# Patient Record
Sex: Male | Born: 1935 | Race: White | Hispanic: No | Smoking: Never smoker
Health system: Southern US, Community
[De-identification: ages and names within clinical notes are randomized; demographics above are authoritative.]

## PROBLEM LIST (undated history)

## (undated) DIAGNOSIS — K279 Peptic ulcer, site unspecified, unspecified as acute or chronic, without hemorrhage or perforation: Secondary | ICD-10-CM

## (undated) DIAGNOSIS — K219 Gastro-esophageal reflux disease without esophagitis: Secondary | ICD-10-CM

## (undated) DIAGNOSIS — M1711 Unilateral primary osteoarthritis, right knee: Secondary | ICD-10-CM

## (undated) DIAGNOSIS — I10 Essential (primary) hypertension: Secondary | ICD-10-CM

## (undated) DIAGNOSIS — E785 Hyperlipidemia, unspecified: Secondary | ICD-10-CM

## (undated) DIAGNOSIS — Z96652 Presence of left artificial knee joint: Secondary | ICD-10-CM

## (undated) DIAGNOSIS — M1712 Unilateral primary osteoarthritis, left knee: Secondary | ICD-10-CM

## (undated) HISTORY — DX: Peptic ulcer, site unspecified, unspecified as acute or chronic, without hemorrhage or perforation: K27.9

## (undated) HISTORY — PX: REPLACEMENT TOTAL KNEE: SUR1224

## (undated) HISTORY — DX: Essential (primary) hypertension: I10

## (undated) HISTORY — PX: FINGER AMPUTATION: SHX636

## (undated) HISTORY — PX: CATARACT EXTRACTION, BILATERAL: SHX1313

---

## 2001-09-07 HISTORY — PX: CHOLECYSTECTOMY: SHX55

## 2004-06-24 ENCOUNTER — Ambulatory Visit: Payer: Self-pay | Admitting: General Surgery

## 2004-07-01 ENCOUNTER — Ambulatory Visit: Payer: Self-pay | Admitting: General Surgery

## 2005-11-30 ENCOUNTER — Emergency Department: Payer: Self-pay | Admitting: Emergency Medicine

## 2005-11-30 ENCOUNTER — Other Ambulatory Visit: Payer: Self-pay

## 2005-12-29 ENCOUNTER — Ambulatory Visit: Payer: Self-pay | Admitting: Urology

## 2005-12-29 ENCOUNTER — Other Ambulatory Visit: Payer: Self-pay

## 2005-12-31 ENCOUNTER — Ambulatory Visit: Payer: Self-pay | Admitting: Urology

## 2006-06-28 ENCOUNTER — Ambulatory Visit: Payer: Self-pay | Admitting: Internal Medicine

## 2008-06-21 ENCOUNTER — Ambulatory Visit: Payer: Self-pay | Admitting: Internal Medicine

## 2008-10-08 ENCOUNTER — Ambulatory Visit: Payer: Self-pay | Admitting: Specialist

## 2008-10-17 ENCOUNTER — Ambulatory Visit: Payer: Self-pay | Admitting: Otolaryngology

## 2009-10-17 ENCOUNTER — Ambulatory Visit: Payer: Self-pay | Admitting: Otolaryngology

## 2011-01-16 ENCOUNTER — Ambulatory Visit: Payer: Self-pay

## 2012-02-09 DIAGNOSIS — M159 Polyosteoarthritis, unspecified: Secondary | ICD-10-CM | POA: Diagnosis not present

## 2012-02-09 DIAGNOSIS — I10 Essential (primary) hypertension: Secondary | ICD-10-CM | POA: Diagnosis not present

## 2012-02-09 DIAGNOSIS — M25569 Pain in unspecified knee: Secondary | ICD-10-CM | POA: Diagnosis not present

## 2012-02-09 DIAGNOSIS — K219 Gastro-esophageal reflux disease without esophagitis: Secondary | ICD-10-CM | POA: Diagnosis not present

## 2012-02-09 DIAGNOSIS — E782 Mixed hyperlipidemia: Secondary | ICD-10-CM | POA: Diagnosis not present

## 2012-02-18 DIAGNOSIS — M1710 Unilateral primary osteoarthritis, unspecified knee: Secondary | ICD-10-CM | POA: Diagnosis not present

## 2012-02-19 ENCOUNTER — Ambulatory Visit: Payer: Self-pay | Admitting: Orthopedic Surgery

## 2012-02-19 DIAGNOSIS — M1710 Unilateral primary osteoarthritis, unspecified knee: Secondary | ICD-10-CM | POA: Diagnosis not present

## 2012-02-19 DIAGNOSIS — IMO0002 Reserved for concepts with insufficient information to code with codable children: Secondary | ICD-10-CM | POA: Diagnosis not present

## 2012-02-19 DIAGNOSIS — M25569 Pain in unspecified knee: Secondary | ICD-10-CM | POA: Diagnosis not present

## 2012-03-31 DIAGNOSIS — M1710 Unilateral primary osteoarthritis, unspecified knee: Secondary | ICD-10-CM | POA: Diagnosis not present

## 2012-05-30 DIAGNOSIS — Z23 Encounter for immunization: Secondary | ICD-10-CM | POA: Diagnosis not present

## 2012-08-01 DIAGNOSIS — I10 Essential (primary) hypertension: Secondary | ICD-10-CM | POA: Diagnosis not present

## 2012-08-01 DIAGNOSIS — Z0000 Encounter for general adult medical examination without abnormal findings: Secondary | ICD-10-CM | POA: Diagnosis not present

## 2012-08-01 DIAGNOSIS — E782 Mixed hyperlipidemia: Secondary | ICD-10-CM | POA: Diagnosis not present

## 2012-08-08 DIAGNOSIS — K116 Mucocele of salivary gland: Secondary | ICD-10-CM | POA: Diagnosis not present

## 2012-08-08 DIAGNOSIS — K219 Gastro-esophageal reflux disease without esophagitis: Secondary | ICD-10-CM | POA: Diagnosis not present

## 2012-08-08 DIAGNOSIS — E041 Nontoxic single thyroid nodule: Secondary | ICD-10-CM | POA: Diagnosis not present

## 2012-08-09 DIAGNOSIS — Z0000 Encounter for general adult medical examination without abnormal findings: Secondary | ICD-10-CM | POA: Diagnosis not present

## 2012-08-09 DIAGNOSIS — E782 Mixed hyperlipidemia: Secondary | ICD-10-CM | POA: Diagnosis not present

## 2012-08-09 DIAGNOSIS — K219 Gastro-esophageal reflux disease without esophagitis: Secondary | ICD-10-CM | POA: Diagnosis not present

## 2012-08-09 DIAGNOSIS — I10 Essential (primary) hypertension: Secondary | ICD-10-CM | POA: Diagnosis not present

## 2012-11-15 DIAGNOSIS — K920 Hematemesis: Secondary | ICD-10-CM | POA: Diagnosis not present

## 2012-11-15 DIAGNOSIS — E782 Mixed hyperlipidemia: Secondary | ICD-10-CM | POA: Diagnosis not present

## 2012-11-15 DIAGNOSIS — K921 Melena: Secondary | ICD-10-CM | POA: Diagnosis not present

## 2012-11-15 DIAGNOSIS — I10 Essential (primary) hypertension: Secondary | ICD-10-CM | POA: Diagnosis not present

## 2012-11-15 DIAGNOSIS — K219 Gastro-esophageal reflux disease without esophagitis: Secondary | ICD-10-CM | POA: Diagnosis not present

## 2012-11-16 DIAGNOSIS — Z006 Encounter for examination for normal comparison and control in clinical research program: Secondary | ICD-10-CM | POA: Diagnosis not present

## 2012-11-16 DIAGNOSIS — K219 Gastro-esophageal reflux disease without esophagitis: Secondary | ICD-10-CM | POA: Diagnosis not present

## 2012-11-16 DIAGNOSIS — I10 Essential (primary) hypertension: Secondary | ICD-10-CM | POA: Diagnosis not present

## 2012-11-22 DIAGNOSIS — K920 Hematemesis: Secondary | ICD-10-CM | POA: Diagnosis not present

## 2012-11-22 DIAGNOSIS — Z8719 Personal history of other diseases of the digestive system: Secondary | ICD-10-CM | POA: Diagnosis not present

## 2012-11-22 DIAGNOSIS — K921 Melena: Secondary | ICD-10-CM | POA: Diagnosis not present

## 2012-11-23 DIAGNOSIS — M159 Polyosteoarthritis, unspecified: Secondary | ICD-10-CM | POA: Diagnosis not present

## 2012-11-23 DIAGNOSIS — I10 Essential (primary) hypertension: Secondary | ICD-10-CM | POA: Diagnosis not present

## 2012-11-23 DIAGNOSIS — K920 Hematemesis: Secondary | ICD-10-CM | POA: Diagnosis not present

## 2012-11-23 DIAGNOSIS — K219 Gastro-esophageal reflux disease without esophagitis: Secondary | ICD-10-CM | POA: Diagnosis not present

## 2012-11-23 DIAGNOSIS — Z006 Encounter for examination for normal comparison and control in clinical research program: Secondary | ICD-10-CM | POA: Diagnosis not present

## 2012-11-23 DIAGNOSIS — E040 Nontoxic diffuse goiter: Secondary | ICD-10-CM | POA: Diagnosis not present

## 2012-12-05 ENCOUNTER — Ambulatory Visit: Payer: Self-pay | Admitting: Unknown Physician Specialty

## 2012-12-05 DIAGNOSIS — K2970 Gastritis, unspecified, without bleeding: Secondary | ICD-10-CM | POA: Diagnosis not present

## 2012-12-05 DIAGNOSIS — Z79899 Other long term (current) drug therapy: Secondary | ICD-10-CM | POA: Diagnosis not present

## 2012-12-05 DIAGNOSIS — K2990 Gastroduodenitis, unspecified, without bleeding: Secondary | ICD-10-CM | POA: Diagnosis not present

## 2012-12-05 DIAGNOSIS — K449 Diaphragmatic hernia without obstruction or gangrene: Secondary | ICD-10-CM | POA: Diagnosis not present

## 2012-12-05 DIAGNOSIS — M47817 Spondylosis without myelopathy or radiculopathy, lumbosacral region: Secondary | ICD-10-CM | POA: Diagnosis not present

## 2012-12-05 DIAGNOSIS — I10 Essential (primary) hypertension: Secondary | ICD-10-CM | POA: Diagnosis not present

## 2012-12-05 DIAGNOSIS — E785 Hyperlipidemia, unspecified: Secondary | ICD-10-CM | POA: Diagnosis not present

## 2012-12-05 DIAGNOSIS — K2971 Gastritis, unspecified, with bleeding: Secondary | ICD-10-CM | POA: Diagnosis not present

## 2012-12-16 DIAGNOSIS — G458 Other transient cerebral ischemic attacks and related syndromes: Secondary | ICD-10-CM | POA: Diagnosis not present

## 2013-02-06 DIAGNOSIS — K277 Chronic peptic ulcer, site unspecified, without hemorrhage or perforation: Secondary | ICD-10-CM | POA: Diagnosis not present

## 2013-02-06 DIAGNOSIS — I10 Essential (primary) hypertension: Secondary | ICD-10-CM | POA: Diagnosis not present

## 2013-02-06 DIAGNOSIS — M25569 Pain in unspecified knee: Secondary | ICD-10-CM | POA: Diagnosis not present

## 2013-02-06 DIAGNOSIS — M159 Polyosteoarthritis, unspecified: Secondary | ICD-10-CM | POA: Diagnosis not present

## 2013-02-06 DIAGNOSIS — K219 Gastro-esophageal reflux disease without esophagitis: Secondary | ICD-10-CM | POA: Diagnosis not present

## 2013-05-09 DIAGNOSIS — I10 Essential (primary) hypertension: Secondary | ICD-10-CM | POA: Diagnosis not present

## 2013-05-09 DIAGNOSIS — L255 Unspecified contact dermatitis due to plants, except food: Secondary | ICD-10-CM | POA: Diagnosis not present

## 2013-05-09 DIAGNOSIS — R21 Rash and other nonspecific skin eruption: Secondary | ICD-10-CM | POA: Diagnosis not present

## 2013-05-09 DIAGNOSIS — K219 Gastro-esophageal reflux disease without esophagitis: Secondary | ICD-10-CM | POA: Diagnosis not present

## 2013-05-30 DIAGNOSIS — Z23 Encounter for immunization: Secondary | ICD-10-CM | POA: Diagnosis not present

## 2013-07-04 DIAGNOSIS — H2510 Age-related nuclear cataract, unspecified eye: Secondary | ICD-10-CM | POA: Diagnosis not present

## 2013-07-06 DIAGNOSIS — I669 Occlusion and stenosis of unspecified cerebral artery: Secondary | ICD-10-CM | POA: Diagnosis not present

## 2013-07-10 DIAGNOSIS — E041 Nontoxic single thyroid nodule: Secondary | ICD-10-CM | POA: Diagnosis not present

## 2013-07-10 DIAGNOSIS — K1370 Unspecified lesions of oral mucosa: Secondary | ICD-10-CM | POA: Diagnosis not present

## 2013-07-10 DIAGNOSIS — K219 Gastro-esophageal reflux disease without esophagitis: Secondary | ICD-10-CM | POA: Diagnosis not present

## 2013-08-16 DIAGNOSIS — Z0000 Encounter for general adult medical examination without abnormal findings: Secondary | ICD-10-CM | POA: Diagnosis not present

## 2013-08-16 DIAGNOSIS — E782 Mixed hyperlipidemia: Secondary | ICD-10-CM | POA: Diagnosis not present

## 2013-08-16 DIAGNOSIS — R300 Dysuria: Secondary | ICD-10-CM | POA: Diagnosis not present

## 2013-08-16 DIAGNOSIS — I10 Essential (primary) hypertension: Secondary | ICD-10-CM | POA: Diagnosis not present

## 2013-08-16 DIAGNOSIS — I669 Occlusion and stenosis of unspecified cerebral artery: Secondary | ICD-10-CM | POA: Diagnosis not present

## 2013-08-16 DIAGNOSIS — K219 Gastro-esophageal reflux disease without esophagitis: Secondary | ICD-10-CM | POA: Diagnosis not present

## 2013-08-17 DIAGNOSIS — Z0000 Encounter for general adult medical examination without abnormal findings: Secondary | ICD-10-CM | POA: Diagnosis not present

## 2013-08-17 DIAGNOSIS — I10 Essential (primary) hypertension: Secondary | ICD-10-CM | POA: Diagnosis not present

## 2013-08-17 DIAGNOSIS — E782 Mixed hyperlipidemia: Secondary | ICD-10-CM | POA: Diagnosis not present

## 2014-01-02 DIAGNOSIS — H2510 Age-related nuclear cataract, unspecified eye: Secondary | ICD-10-CM | POA: Diagnosis not present

## 2014-02-14 DIAGNOSIS — K219 Gastro-esophageal reflux disease without esophagitis: Secondary | ICD-10-CM | POA: Diagnosis not present

## 2014-02-14 DIAGNOSIS — K277 Chronic peptic ulcer, site unspecified, without hemorrhage or perforation: Secondary | ICD-10-CM | POA: Diagnosis not present

## 2014-02-14 DIAGNOSIS — M25569 Pain in unspecified knee: Secondary | ICD-10-CM | POA: Diagnosis not present

## 2014-02-14 DIAGNOSIS — E782 Mixed hyperlipidemia: Secondary | ICD-10-CM | POA: Diagnosis not present

## 2014-05-22 DIAGNOSIS — Z23 Encounter for immunization: Secondary | ICD-10-CM | POA: Diagnosis not present

## 2014-08-21 DIAGNOSIS — M150 Primary generalized (osteo)arthritis: Secondary | ICD-10-CM | POA: Diagnosis not present

## 2014-08-21 DIAGNOSIS — Z0001 Encounter for general adult medical examination with abnormal findings: Secondary | ICD-10-CM | POA: Diagnosis not present

## 2014-08-21 DIAGNOSIS — Z125 Encounter for screening for malignant neoplasm of prostate: Secondary | ICD-10-CM | POA: Diagnosis not present

## 2014-08-21 DIAGNOSIS — E782 Mixed hyperlipidemia: Secondary | ICD-10-CM | POA: Diagnosis not present

## 2014-08-21 DIAGNOSIS — I10 Essential (primary) hypertension: Secondary | ICD-10-CM | POA: Diagnosis not present

## 2014-08-21 DIAGNOSIS — R300 Dysuria: Secondary | ICD-10-CM | POA: Diagnosis not present

## 2014-08-27 DIAGNOSIS — Z125 Encounter for screening for malignant neoplasm of prostate: Secondary | ICD-10-CM | POA: Diagnosis not present

## 2014-08-27 DIAGNOSIS — Z0001 Encounter for general adult medical examination with abnormal findings: Secondary | ICD-10-CM | POA: Diagnosis not present

## 2014-08-27 DIAGNOSIS — E782 Mixed hyperlipidemia: Secondary | ICD-10-CM | POA: Diagnosis not present

## 2015-01-02 DIAGNOSIS — M25561 Pain in right knee: Secondary | ICD-10-CM | POA: Diagnosis not present

## 2015-01-02 DIAGNOSIS — M170 Bilateral primary osteoarthritis of knee: Secondary | ICD-10-CM | POA: Diagnosis not present

## 2015-01-02 DIAGNOSIS — M25562 Pain in left knee: Secondary | ICD-10-CM | POA: Diagnosis not present

## 2015-01-04 ENCOUNTER — Ambulatory Visit
Admit: 2015-01-04 | Disposition: A | Discharge status: Home / Self Care | Payer: Self-pay | Admitting: Orthopedic Surgery

## 2015-01-04 DIAGNOSIS — M170 Bilateral primary osteoarthritis of knee: Secondary | ICD-10-CM | POA: Diagnosis not present

## 2015-01-04 DIAGNOSIS — M1712 Unilateral primary osteoarthritis, left knee: Secondary | ICD-10-CM | POA: Diagnosis not present

## 2015-01-04 DIAGNOSIS — M1711 Unilateral primary osteoarthritis, right knee: Secondary | ICD-10-CM | POA: Diagnosis not present

## 2015-01-30 DIAGNOSIS — M170 Bilateral primary osteoarthritis of knee: Secondary | ICD-10-CM | POA: Diagnosis not present

## 2015-02-06 DIAGNOSIS — M170 Bilateral primary osteoarthritis of knee: Secondary | ICD-10-CM | POA: Diagnosis not present

## 2015-02-13 DIAGNOSIS — M170 Bilateral primary osteoarthritis of knee: Secondary | ICD-10-CM | POA: Diagnosis not present

## 2015-02-21 DIAGNOSIS — K219 Gastro-esophageal reflux disease without esophagitis: Secondary | ICD-10-CM | POA: Diagnosis not present

## 2015-02-21 DIAGNOSIS — M179 Osteoarthritis of knee, unspecified: Secondary | ICD-10-CM | POA: Diagnosis not present

## 2015-02-21 DIAGNOSIS — E782 Mixed hyperlipidemia: Secondary | ICD-10-CM | POA: Diagnosis not present

## 2015-03-04 DIAGNOSIS — Z111 Encounter for screening for respiratory tuberculosis: Secondary | ICD-10-CM | POA: Diagnosis not present

## 2015-03-12 DIAGNOSIS — Z111 Encounter for screening for respiratory tuberculosis: Secondary | ICD-10-CM | POA: Diagnosis not present

## 2015-08-26 DIAGNOSIS — E782 Mixed hyperlipidemia: Secondary | ICD-10-CM | POA: Diagnosis not present

## 2015-08-26 DIAGNOSIS — K219 Gastro-esophageal reflux disease without esophagitis: Secondary | ICD-10-CM | POA: Diagnosis not present

## 2015-08-26 DIAGNOSIS — M179 Osteoarthritis of knee, unspecified: Secondary | ICD-10-CM | POA: Diagnosis not present

## 2015-08-26 DIAGNOSIS — Z0001 Encounter for general adult medical examination with abnormal findings: Secondary | ICD-10-CM | POA: Diagnosis not present

## 2015-09-03 DIAGNOSIS — Z0001 Encounter for general adult medical examination with abnormal findings: Secondary | ICD-10-CM | POA: Diagnosis not present

## 2015-09-03 DIAGNOSIS — Z125 Encounter for screening for malignant neoplasm of prostate: Secondary | ICD-10-CM | POA: Diagnosis not present

## 2015-09-03 DIAGNOSIS — E782 Mixed hyperlipidemia: Secondary | ICD-10-CM | POA: Diagnosis not present

## 2015-09-03 DIAGNOSIS — Z1211 Encounter for screening for malignant neoplasm of colon: Secondary | ICD-10-CM | POA: Diagnosis not present

## 2015-12-14 IMAGING — CR DG KNEE 1-2V BILAT
1 series · 2 of 2 positions shown · non-contrast
Comparison: None.

CLINICAL DATA: Bilateral knee pain.

EXAM:
BILATERAL KNEE 1-2 VIEW(S)

[Series 1: kdxr bilateral knees standing · 0.14mm/px · 2 of 2 slices shown]
[im 1/2]
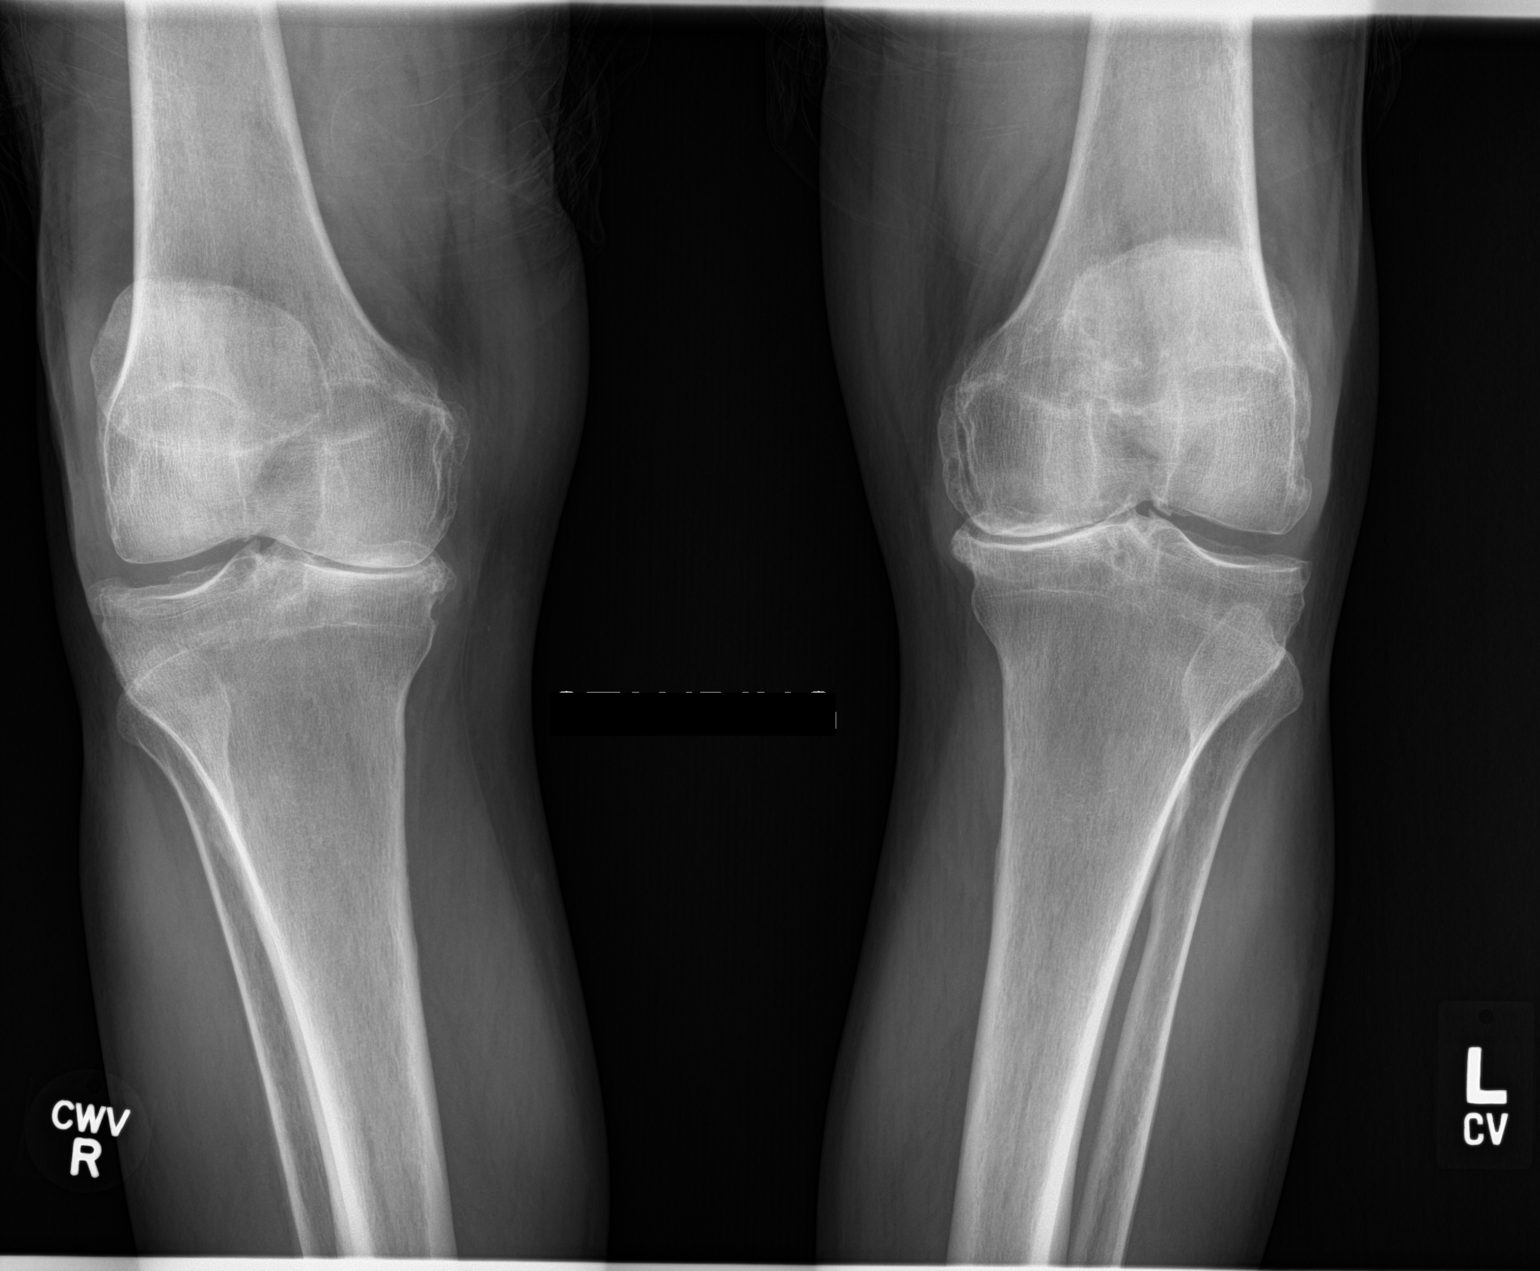
[im 2/2]
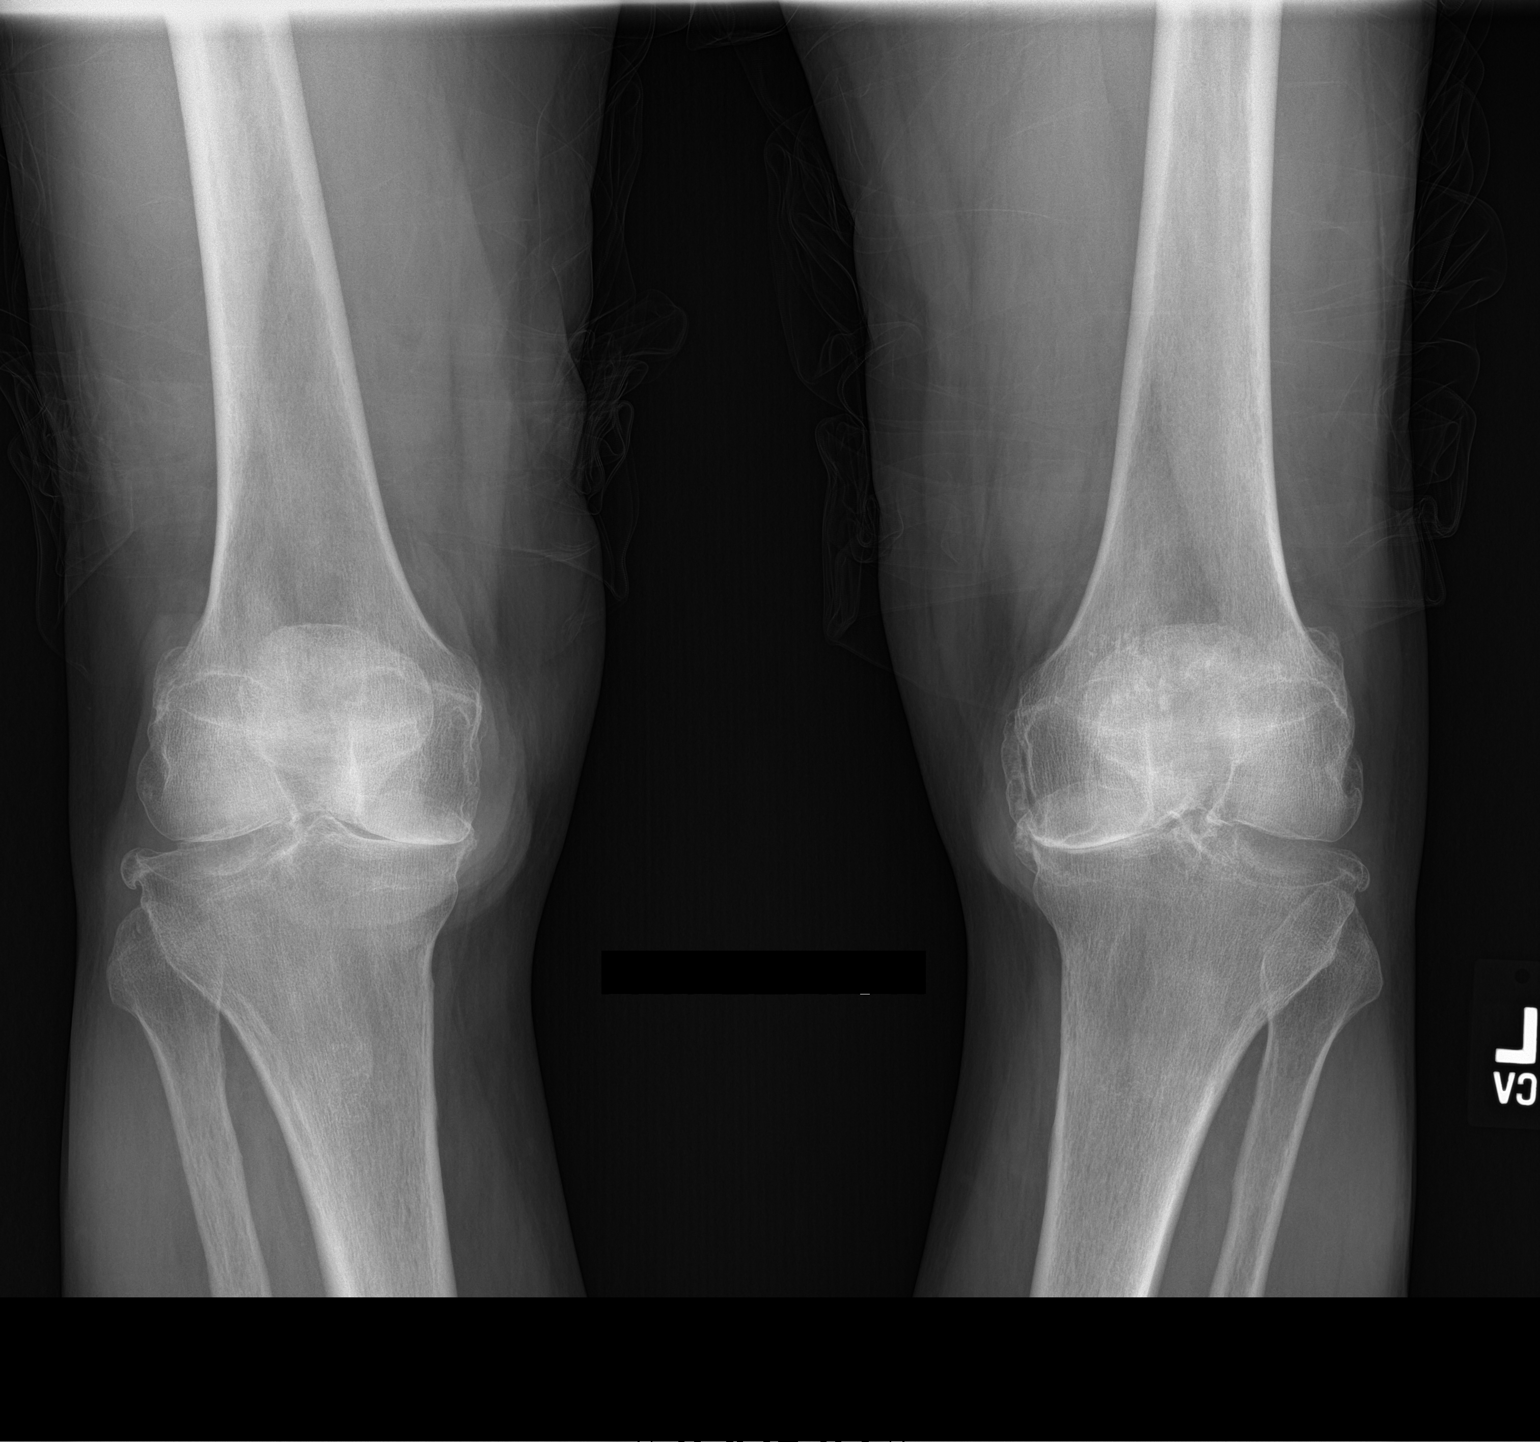

[2 of 2 positions shown; findings below may reference images not displayed]

FINDINGS: No acute bony or joint abnormality. Severe diffuse tricompartment
degenerative change. Degenerative changes most severe about the
medial compartment.
IMPRESSION: Severe tricompartment degenerative change. Degenerative changes most
prominent about the medial compartment. No acute abnormality.

## 2016-02-12 DIAGNOSIS — L237 Allergic contact dermatitis due to plants, except food: Secondary | ICD-10-CM | POA: Diagnosis not present

## 2016-02-21 ENCOUNTER — Ambulatory Visit
Admission: RE | Admit: 2016-02-21 | Discharge: 2016-02-21 | Disposition: A | Discharge status: Home / Self Care | Payer: Medicare Other | Source: Ambulatory Visit | Attending: Orthopedic Surgery | Admitting: Orthopedic Surgery

## 2016-02-21 ENCOUNTER — Other Ambulatory Visit: Payer: Self-pay | Admitting: Orthopedic Surgery

## 2016-02-21 DIAGNOSIS — M170 Bilateral primary osteoarthritis of knee: Secondary | ICD-10-CM | POA: Insufficient documentation

## 2016-02-21 DIAGNOSIS — M25461 Effusion, right knee: Secondary | ICD-10-CM | POA: Diagnosis not present

## 2016-02-21 DIAGNOSIS — M25462 Effusion, left knee: Secondary | ICD-10-CM | POA: Insufficient documentation

## 2016-02-21 DIAGNOSIS — M25562 Pain in left knee: Secondary | ICD-10-CM | POA: Insufficient documentation

## 2016-02-21 DIAGNOSIS — M25561 Pain in right knee: Secondary | ICD-10-CM | POA: Insufficient documentation

## 2016-04-08 ENCOUNTER — Encounter (HOSPITAL_COMMUNITY): Payer: Self-pay | Admitting: Physician Assistant

## 2016-04-08 DIAGNOSIS — M1711 Unilateral primary osteoarthritis, right knee: Secondary | ICD-10-CM | POA: Diagnosis present

## 2016-04-08 DIAGNOSIS — E782 Mixed hyperlipidemia: Secondary | ICD-10-CM | POA: Diagnosis present

## 2016-04-08 DIAGNOSIS — K219 Gastro-esophageal reflux disease without esophagitis: Secondary | ICD-10-CM | POA: Diagnosis present

## 2016-04-08 NOTE — H&P (Signed)
TOTAL KNEE ADMISSION H&P  Patient is being admitted for left total knee arthroplasty.  Subjective:  Chief Complaint:left knee pain.  HPI: Danny [name], 82 y.o. male, has a history of pain and functional disability in the left knee due to arthritis and has failed non-surgical conservative treatments for greater than 12 weeks to includeNSAID's and/or analgesics, corticosteriod injections, viscosupplementation injections, flexibility and strengthening excercises, use of assistive devices and weight reduction as appropriate.  Onset of symptoms was gradual, starting 10 years ago with gradually worsening course since that time. The patient noted no past surgery on the left knee(s).  Patient currently rates pain in the left knee(s) at 10 out of 10 with activity. Patient has night pain, worsening of pain with activity and weight bearing, pain that interferes with activities of daily living, crepitus and joint swelling.  Patient has evidence of subchondral sclerosis, periarticular osteophytes and joint space narrowing by imaging studies.  There is no active infection.  Patient Active Problem List   Diagnosis Date Noted  . GERD (gastroesophageal reflux disease)   . Hyperlipidemia   . Primary localized osteoarthritis of left knee    Past Medical History:  Diagnosis Date  . GERD (gastroesophageal reflux disease)   . Hyperlipidemia   . Primary localized osteoarthritis of left knee     Past Surgical History:  Procedure Laterality Date  . CHOLECYSTECTOMY  2003    No prescriptions prior to admission.   No Known Allergies  Social History  Substance Use Topics  . Smoking status: Never Smoker  . Smokeless tobacco: Not on file  . Alcohol use No    Family History  Problem Relation Age of Onset  . Parkinson's disease Mother   . Pneumonia Father   . Heart disease Sister   . Hypertension Sister   . Stroke Sister      Review of Systems  Constitutional: Negative.   HENT: Negative.   Eyes:  Negative.   Respiratory: Negative.   Cardiovascular: Negative.   Gastrointestinal: Negative.   Genitourinary: Negative.   Musculoskeletal: Positive for joint pain.  Skin: Negative.   Neurological: Negative.   Endo/Heme/Allergies: Negative.   Psychiatric/Behavioral: Negative.     Objective:  Physical Exam  Constitutional: He is oriented to person, place, and time. He appears well-developed and well-nourished.  HENT:  Head: Normocephalic and atraumatic.  Mouth/Throat: Oropharynx is clear and moist.  Eyes: Conjunctivae are normal. Pupils are equal, round, and reactive to light.  Neck: Neck supple.  Cardiovascular: Normal rate and regular rhythm.   Occasional extra beat  Soft murmur  Respiratory: Effort normal and breath sounds normal.  GI: Soft. Bowel sounds are normal.  Genitourinary:  Genitourinary Comments: Not pertinent to current symptomatology therefore not examined.  Musculoskeletal:  Examination of both knees reveal pain bilaterally, left worse than right.  1+ synovitis.  Mild varus deformity.  Knees are stable to ligamentous exam with normal patella tracking.  Vascular exam: Pulses are 2+ and symmetric.  Neurologic exam: Distal motor and sensory examination is within normal limits  Neurological: He is alert and oriented to person, place, and time.  Skin: Skin is warm and dry.  Psychiatric: He has a normal mood and affect. His behavior is normal.    Vital signs in last 24 hours: Temp:  [98.5 F (36.9 C)] 98.5 F (36.9 C) (08/02 1500) Pulse Rate:  [71] 71 (08/02 1500) BP: (160)/(81) 160/81 (08/02 1500) SpO2:  [96 %] 96 % (08/02 1500) Weight:  [85.7 kg (189 lb)] 85.7  kg (189 lb) (08/02 1500)  Labs:   Estimated body mass index is 27.12 kg/m as calculated from the following:   Height as of this encounter: 5\' 10"  (1.778 m).   Weight as of this encounter: 85.7 kg (189 lb).   Imaging Review Plain radiographs demonstrate severe degenerative joint disease of the left  knee(s). The overall alignment issignificant varus. The bone quality appears to be good for age and reported activity level.  Assessment/Plan:  End stage arthritis, left knee  Principal Problem:   Primary localized osteoarthritis of left knee Active Problems:   GERD (gastroesophageal reflux disease)   Hyperlipidemia   The patient history, physical examination, clinical judgment of the provider and imaging studies are consistent with end stage degenerative joint disease of the left knee(s) and total knee arthroplasty is deemed medically necessary. The treatment options including medical management, injection therapy arthroscopy and arthroplasty were discussed at length. The risks and benefits of total knee arthroplasty were presented and reviewed. The risks due to aseptic loosening, infection, stiffness, patella tracking problems, thromboembolic complications and other imponderables were discussed. The patient acknowledged the explanation, agreed to proceed with the plan and consent was signed. Patient is being admitted for inpatient treatment for surgery, pain control, PT, OT, prophylactic antibiotics, VTE prophylaxis, progressive ambulation and ADL's and discharge planning. The patient is planning to be discharged home with home health services

## 2016-04-16 ENCOUNTER — Encounter (HOSPITAL_COMMUNITY)
Admission: RE | Admit: 2016-04-16 | Discharge: 2016-04-16 | Disposition: A | Discharge status: Home / Self Care | Payer: Medicare Other | Source: Ambulatory Visit | Attending: Orthopedic Surgery | Admitting: Orthopedic Surgery

## 2016-04-16 ENCOUNTER — Encounter (HOSPITAL_COMMUNITY): Payer: Self-pay

## 2016-04-16 DIAGNOSIS — M1712 Unilateral primary osteoarthritis, left knee: Secondary | ICD-10-CM | POA: Insufficient documentation

## 2016-04-16 DIAGNOSIS — Z01812 Encounter for preprocedural laboratory examination: Secondary | ICD-10-CM | POA: Insufficient documentation

## 2016-04-16 DIAGNOSIS — R9431 Abnormal electrocardiogram [ECG] [EKG]: Secondary | ICD-10-CM | POA: Diagnosis not present

## 2016-04-16 DIAGNOSIS — Z0183 Encounter for blood typing: Secondary | ICD-10-CM | POA: Diagnosis not present

## 2016-04-16 DIAGNOSIS — Z01818 Encounter for other preprocedural examination: Secondary | ICD-10-CM | POA: Diagnosis present

## 2016-04-16 LAB — CBC WITH DIFFERENTIAL/PLATELET
Basophils Absolute: 0 10*3/uL (ref 0.0–0.1)
Basophils Relative: 0 %
Eosinophils Absolute: 0.2 10*3/uL (ref 0.0–0.7)
Eosinophils Relative: 2 %
HEMATOCRIT: 48.4 % (ref 39.0–52.0)
HEMOGLOBIN: 16.4 g/dL (ref 13.0–17.0)
LYMPHS ABS: 1.6 10*3/uL (ref 0.7–4.0)
LYMPHS PCT: 22 %
MCH: 30.4 pg (ref 26.0–34.0)
MCHC: 33.9 g/dL (ref 30.0–36.0)
MCV: 89.6 fL (ref 78.0–100.0)
Monocytes Absolute: 0.6 10*3/uL (ref 0.1–1.0)
Monocytes Relative: 8 %
NEUTROS PCT: 68 %
Neutro Abs: 4.9 10*3/uL (ref 1.7–7.7)
Platelets: 186 10*3/uL (ref 150–400)
RBC: 5.4 MIL/uL (ref 4.22–5.81)
RDW: 13.4 % (ref 11.5–15.5)
WBC: 7.3 10*3/uL (ref 4.0–10.5)

## 2016-04-16 LAB — COMPREHENSIVE METABOLIC PANEL
ALK PHOS: 88 U/L (ref 38–126)
ALT: 13 U/L — AB (ref 17–63)
AST: 17 U/L (ref 15–41)
Albumin: 4 g/dL (ref 3.5–5.0)
Anion gap: 6 (ref 5–15)
BUN: 13 mg/dL (ref 6–20)
CO2: 28 mmol/L (ref 22–32)
CREATININE: 1.13 mg/dL (ref 0.61–1.24)
Calcium: 9.2 mg/dL (ref 8.9–10.3)
Chloride: 105 mmol/L (ref 101–111)
GFR, EST NON AFRICAN AMERICAN: 58 mL/min — AB (ref >60)
Glucose, Bld: 93 mg/dL (ref 65–99)
Potassium: 3.8 mmol/L (ref 3.5–5.1)
Sodium: 139 mmol/L (ref 135–145)
TOTAL PROTEIN: 6.9 g/dL (ref 6.5–8.1)
Total Bilirubin: 1.3 mg/dL — ABNORMAL HIGH (ref 0.3–1.2)

## 2016-04-16 LAB — TYPE AND SCREEN
ABO/RH(D): O NEG
Antibody Screen: NEGATIVE

## 2016-04-16 LAB — ABO/RH: ABO/RH(D): O NEG

## 2016-04-16 LAB — PROTIME-INR
INR: 1.09
PROTHROMBIN TIME: 14.2 s (ref 11.4–15.2)

## 2016-04-16 LAB — APTT: aPTT: 34 seconds (ref 24–36)

## 2016-04-16 LAB — SURGICAL PCR SCREEN
MRSA, PCR: NEGATIVE
Staphylococcus aureus: NEGATIVE

## 2016-04-16 NOTE — Pre-Procedure Instructions (Signed)
Danny [name]  04/16/2016      [id number] [place], [place] - [address] [name] [address] Phone: [phone] Fax: [phone]    Your procedure is scheduled on 04/27/2016  Report to [facility] Admitting at 8:00 A.M.  Call this number if you have problems the morning of surgery:  [phone]   Remember:  Do not eat food or drink liquids after midnight.  Take these medicines the morning of surgery with A SIP OF WATER : NEXIUM   Do not wear jewelry   Do not wear lotions, powders, or perfumes.  You may wear deoderant.    Men may shave face and neck.   Do not bring valuables to the hospital.   [facility] is not responsible for any belongings or valuables.  Contacts, dentures or bridgework may not be worn into surgery.  Leave your suitcase in the car.  After surgery it may be brought to your room.  For patients admitted to the hospital, discharge time will be determined by your treatment team.  Patients discharged the day of surgery will not be allowed to drive home.   Name and phone number of your driver:   With family   Special instructions:  Special Instructions: [place] - Preparing for Surgery  Before surgery, you can play an important role.  Because skin is not sterile, your skin needs to be as free of germs as possible.  You can reduce the number of germs on you skin by washing with CHG (chlorahexidine gluconate) soap before surgery.  CHG is an antiseptic cleaner which kills germs and bonds with the skin to continue killing germs even after washing.  Please DO NOT use if you have an allergy to CHG or antibacterial soaps.  If your skin becomes reddened/irritated stop using the CHG and inform your nurse when you arrive at Short Stay.  Do not shave (including legs and underarms) for at least 48 hours prior to the first CHG shower.  You may shave your face.  Please follow these instructions carefully:   1.  Shower  with CHG Soap the night before surgery and the  morning of Surgery.  2.  If you choose to wash your hair, wash your hair first as usual with your  normal shampoo.  3.  After you shampoo, rinse your hair and body thoroughly to remove the  Shampoo.  4.  Use CHG as you would any other liquid soap.  You can apply chg directly to the skin and wash gently with scrungie or a clean washcloth.  5.  Apply the CHG Soap to your body ONLY FROM THE NECK DOWN.    Do not use on open wounds or open sores.  Avoid contact with your eyes, ears, mouth and genitals (private parts).  Wash genitals (private parts)   with your normal soap.  6.  Wash thoroughly, paying special attention to the area where your surgery will be performed.  7.  Thoroughly rinse your body with warm water from the neck down.  8.  DO NOT shower/wash with your normal soap after using and rinsing off   the CHG Soap.  9.  Pat yourself dry with a clean towel.            10.  Wear clean pajamas.            11.  Place clean sheets on your bed the night of your first  shower and do not sleep with pets.  Day of Surgery  Do not apply any lotions/deodorants the morning of surgery.  Please wear clean clothes to the hospital/surgery center.  Please read over the following fact sheets that you were given. Pain Booklet, Total Joint Packet, MRSA Information and Surgical Site Infection Prevention

## 2016-04-16 NOTE — Progress Notes (Signed)
Pt. Followed at [facility] [place].  He reports he had a stress test > 10 yrs. Ago, as a baseline study , told it was wnl. Pt. Unsure when he might have had an  ekg last.  Pt. Reports that he has been treated for ^BP previously, but at one point he was taken off the medicine by the doctor & told that he didn't need it any longer.

## 2016-04-17 LAB — URINE CULTURE: Culture: NO GROWTH

## 2016-04-20 NOTE — Progress Notes (Signed)
[facility] [place]  Last [facility], spoke with Med. [facility].

## 2016-04-21 ENCOUNTER — Encounter: Payer: Self-pay | Admitting: Nurse Practitioner

## 2016-04-23 ENCOUNTER — Ambulatory Visit (INDEPENDENT_AMBULATORY_CARE_PROVIDER_SITE_OTHER): Payer: Medicare Other | Admitting: Nurse Practitioner

## 2016-04-23 ENCOUNTER — Encounter (INDEPENDENT_AMBULATORY_CARE_PROVIDER_SITE_OTHER): Payer: Self-pay

## 2016-04-23 ENCOUNTER — Encounter: Payer: Self-pay | Admitting: Nurse Practitioner

## 2016-04-23 VITALS — BP 130/90 | HR 90 | Ht 68.0 in | Wt 185.4 lb

## 2016-04-23 DIAGNOSIS — I10 Essential (primary) hypertension: Secondary | ICD-10-CM | POA: Insufficient documentation

## 2016-04-23 DIAGNOSIS — Z01810 Encounter for preprocedural cardiovascular examination: Secondary | ICD-10-CM | POA: Diagnosis not present

## 2016-04-23 NOTE — Progress Notes (Signed)
Cardiology Clinic Note   Patient Name: Danny [name] Date of Encounter: 04/23/2016  Primary Care Provider:  Lyndon CodeKHAN, FOZIA M, MD Primary Cardiologist:  New  Patient Profile    82 year old male without prior cardiac history who presents for new patient evaluation and preoperative clearance pending left total knee arthroplasty next week.  Past Medical History    Past Medical History:  Diagnosis Date  . Essential hypertension    a. Dr. Welton FlakesKhan in [place] managing.  Previously on antihypertensive, which was later d/c'd.  Marland Kitchen. GERD (gastroesophageal reflux disease)   . Hyperlipidemia   . Primary localized osteoarthritis of left knee    a. L knee, pending TKA.  . PUD (peptic ulcer disease)    a. Remote h/o coffee grounds emesis followed by GI eval.  On Nexium since.   Past Surgical History:  Procedure Laterality Date  . CHOLECYSTECTOMY  2003  . 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 He does have a history of GERD, peptic ulcer disease, hyperlipidemia, osteoarthritis/degenerative joint disease with progressive left knee pain, and gallbladder disease status post remote cholecystectomy. He lives with his wife in [place] and is reasonably active. His wife has Parkinson's and significant short-term memory loss, thus he serves as her primary caregiver. He is also active around his home and yard. He can push mow portions of his yard without experiencing chest pain, dyspnea, or limitations in activity with the exception of left knee pain. Because of progressive left knee pain, despite conservative efforts including anti-inflammatories and intra-articular cortisone injections, he is now pending left total knee arthroplasty, which is tentatively scheduled for next Monday, August 21. He presents today for preoperative evaluation. He denies any prior history of chest pain,  palpitations, PND, orthopnea, dizziness, syncope, edema, or early satiety.  Home Medications    Prior to Admission medications   Medication Sig Start Date End Date Taking? Authorizing Provider  esomeprazole (NEXIUM) 40 MG capsule Take 40 mg by mouth daily at 12 noon.   Yes Historical Provider, MD  pravastatin (PRAVACHOL) 40 MG tablet Take 40 mg by mouth daily before breakfast.    Yes Historical Provider, MD    Family History    Family History  Problem Relation Age of Onset  . Parkinson's disease Mother     died @ 6394  . Pneumonia Father     died in his 4220's  . Heart disease Sister     1/2 sister - currently 6964.  Marland Kitchen. Hypertension Sister   . Stroke Sister     Social History    Social History   Social History  . Marital status: Married    Spouse name: N/A  . Number of children: N/A  . Years of education: N/A   Occupational History  .      Previously worked in [employer] in [place]   Social History Main Topics  . Smoking status: Never Smoker  . Smokeless tobacco: Former NeurosurgeonUser  . Alcohol use Yes     Comment: occasional glass of wine.  . Drug use: No  . Sexual activity: Not on file   Other Topics Concern  . Not on file   Social History Narrative   Married takes care of wife who has Parkinson's and Early Alzheimers.  Fairly active.     Review of Systems  General:  No chills, fever, night sweats or weight changes.  Cardiovascular:  No chest pain, dyspnea on exertion, edema, orthopnea, palpitations, paroxysmal nocturnal dyspnea. Dermatological: No rash, lesions/masses Respiratory: No cough, dyspnea Urologic: No hematuria, dysuria Abdominal:   No nausea, vomiting, diarrhea, bright red blood per rectum, melena, or hematemesis Neurologic:  No visual changes, wkns, changes in mental status. Musculoskeletal: +++ Left knee pain. All other systems reviewed and are otherwise negative except as noted above.  Physical Exam    VS:  BP 130/90   Pulse 90   Ht 5\' 8"   (1.727 m)   Wt 185 lb 6.4 oz (84.1 kg)   BMI 28.19 kg/m  , BMI Body mass index is 28.19 kg/m. GEN: Well nourished, well developed, in no acute distress.  HEENT: normal.  Neck: Supple, no JVD, carotid bruits, or masses. Cardiac: RRR, no murmurs, rubs, or gallops. No clubbing, cyanosis, edema.  Radials/DP/PT 2+ and equal bilaterally.  Respiratory:  Respirations regular and unlabored, clear to auscultation bilaterally. GI: Soft, nontender, nondistended, BS + x 4. MS: no deformity or atrophy. Skin: warm and dry, no rash. Neuro:  Strength and sensation are intact. Psych: Normal affect.  Accessory Clinical Findings    ECG - Regular sinus rhythm, 90, PAC, nonspecific T changes.  Assessment & Plan   1.  Preoperative cardiovascular evaluation: Patient presents for evaluation in the setting of left knee degenerative joint disease and osteoarthritis, pending left total knee arthroplasty on Monday, August 21. He has no prior cardiac history and from a cardiac standpoint appears to do quite well at home. His only limitation is left knee pain. He is still reasonably active and able to push mow portions of his yard without experiencing chest pain or dyspnea. His ECG shows no acute ST or T changes. I discussed this case with Dr. Elberta Fortisamnitz today.  Given reasonable functional status without symptoms or limitations, he will not require any additional preoperative cardiac testing prior to proceeding with surgery on Monday. We do recommend continuation of statin therapy throughout the perioperative period. Our team will be available for consultation if necessary.  2. Hyperlipidemia: This is followed closely by his primary care provider. He remains on Pravachol therapy.  3. History of hypertension: Patient was previously treated with antihypertensive medication, however this was reportedly discontinued a few years ago. His diastolic blood pressure is mildly elevated today but he says typically his blood pressures  are within a normal range.  4. GERD with history of peptic ulcer disease: This is been stable for several years on Nexium therapy.   5. Disposition: Patient may proceed with left knee surgery and does not require any additional ischemic evaluation at this time. Follow-up in clinic as necessary.  Nicolasa Duckinghristopher Shahla Betsill , NP 04/23/2016, 12:13 PM

## 2016-04-23 NOTE — Patient Instructions (Signed)
Medication Instructions:  Your physician recommends that you continue on your current medications as directed. Please refer to the Current Medication list given to you today.  Labwork: No new orders.   Testing/Procedures: No new orders.   Follow-Up: Your physician recommends that you schedule a follow-up appointment as needed with Cardiology.     Any Other Special Instructions Will Be Listed Below (If Applicable).     If you need a refill on your cardiac medications before your next appointment, please call your pharmacy.

## 2016-04-26 MED ORDER — CEFAZOLIN SODIUM-DEXTROSE 2-4 GM/100ML-% IV SOLN
2.0000 g | INTRAVENOUS | Status: AC
  Administered 2016-04-27: 2 g via INTRAVENOUS
  Filled 2016-04-26: qty 100

## 2016-04-26 MED ORDER — LACTATED RINGERS IV SOLN
INTRAVENOUS | Status: DC
  Administered 2016-04-27 (×2): via INTRAVENOUS

## 2016-04-27 ENCOUNTER — Encounter (HOSPITAL_COMMUNITY)
Admission: RE | Disposition: A | Discharge status: Home Health | Payer: Self-pay | Source: Ambulatory Visit | Attending: Orthopedic Surgery

## 2016-04-27 ENCOUNTER — Inpatient Hospital Stay (HOSPITAL_COMMUNITY): Payer: Medicare Other | Admitting: Anesthesiology

## 2016-04-27 ENCOUNTER — Encounter (HOSPITAL_COMMUNITY): Payer: Self-pay | Admitting: *Deleted

## 2016-04-27 ENCOUNTER — Inpatient Hospital Stay (HOSPITAL_COMMUNITY)
Admission: RE | Admit: 2016-04-27 | Discharge: 2016-04-28 | DRG: 470 | Disposition: A | Discharge status: Home Health | Payer: Medicare Other | Source: Ambulatory Visit | Attending: Orthopedic Surgery | Admitting: Orthopedic Surgery

## 2016-04-27 DIAGNOSIS — E785 Hyperlipidemia, unspecified: Secondary | ICD-10-CM | POA: Diagnosis present

## 2016-04-27 DIAGNOSIS — I10 Essential (primary) hypertension: Secondary | ICD-10-CM | POA: Diagnosis present

## 2016-04-27 DIAGNOSIS — Z8711 Personal history of peptic ulcer disease: Secondary | ICD-10-CM

## 2016-04-27 DIAGNOSIS — K219 Gastro-esophageal reflux disease without esophagitis: Secondary | ICD-10-CM | POA: Diagnosis present

## 2016-04-27 DIAGNOSIS — M25562 Pain in left knee: Secondary | ICD-10-CM | POA: Diagnosis present

## 2016-04-27 DIAGNOSIS — M1712 Unilateral primary osteoarthritis, left knee: Secondary | ICD-10-CM | POA: Diagnosis present

## 2016-04-27 DIAGNOSIS — E782 Mixed hyperlipidemia: Secondary | ICD-10-CM | POA: Diagnosis present

## 2016-04-27 DIAGNOSIS — M1711 Unilateral primary osteoarthritis, right knee: Secondary | ICD-10-CM | POA: Diagnosis present

## 2016-04-27 HISTORY — DX: Gastro-esophageal reflux disease without esophagitis: K21.9

## 2016-04-27 HISTORY — DX: Unilateral primary osteoarthritis, left knee: M17.12

## 2016-04-27 HISTORY — PX: TOTAL KNEE ARTHROPLASTY: SHX125

## 2016-04-27 HISTORY — DX: Hyperlipidemia, unspecified: E78.5

## 2016-04-27 SURGERY — ARTHROPLASTY, KNEE, TOTAL
Anesthesia: Regional | Site: Knee | Laterality: Left

## 2016-04-27 MED ORDER — DEXTROSE 5 % IV SOLN
INTRAVENOUS | Status: DC | PRN
  Administered 2016-04-27: 25 ug/min via INTRAVENOUS

## 2016-04-27 MED ORDER — PRAVASTATIN SODIUM 40 MG PO TABS
40.0000 mg | ORAL_TABLET | Freq: Every day | ORAL | Status: DC
  Administered 2016-04-28: 40 mg via ORAL
  Filled 2016-04-27: qty 1

## 2016-04-27 MED ORDER — OXYCODONE HCL 5 MG/5ML PO SOLN: 5.0000 mg | Freq: Once | ORAL | Status: DC | PRN

## 2016-04-27 MED ORDER — LACTATED RINGERS IV SOLN
INTRAVENOUS | Status: DC
Start: 2016-04-27 — End: 2016-04-27

## 2016-04-27 MED ORDER — POVIDONE-IODINE 7.5 % EX SOLN
Freq: Once | CUTANEOUS | Status: DC
  Filled 2016-04-27: qty 118

## 2016-04-27 MED ORDER — ACETAMINOPHEN 325 MG PO TABS
650.0000 mg | ORAL_TABLET | Freq: Four times a day (QID) | ORAL | Status: DC | PRN
Start: 2016-04-27 — End: 2016-04-28
  Administered 2016-04-28: 650 mg via ORAL
  Filled 2016-04-27: qty 2

## 2016-04-27 MED ORDER — ALUM & MAG HYDROXIDE-SIMETH 200-200-20 MG/5ML PO SUSP: 30.0000 mL | ORAL | Status: DC | PRN

## 2016-04-27 MED ORDER — ACETAMINOPHEN 650 MG RE SUPP: 650.0000 mg | Freq: Four times a day (QID) | RECTAL | Status: DC | PRN

## 2016-04-27 MED ORDER — DEXAMETHASONE SODIUM PHOSPHATE 10 MG/ML IJ SOLN
INTRAMUSCULAR | Status: DC | PRN
  Administered 2016-04-27: 10 mg via INTRAVENOUS

## 2016-04-27 MED ORDER — METOCLOPRAMIDE HCL 5 MG PO TABS: 5.0000 mg | ORAL_TABLET | Freq: Three times a day (TID) | ORAL | Status: DC | PRN

## 2016-04-27 MED ORDER — ROPIVACAINE HCL 5 MG/ML IJ SOLN
INTRAMUSCULAR | Status: DC | PRN
  Administered 2016-04-27: 30 mL via PERINEURAL

## 2016-04-27 MED ORDER — CEFAZOLIN SODIUM-DEXTROSE 2-4 GM/100ML-% IV SOLN
2.0000 g | Freq: Four times a day (QID) | INTRAVENOUS | Status: AC
  Administered 2016-04-27 (×2): 2 g via INTRAVENOUS
  Filled 2016-04-27 (×2): qty 100

## 2016-04-27 MED ORDER — DEXAMETHASONE SODIUM PHOSPHATE 10 MG/ML IJ SOLN
10.0000 mg | Freq: Three times a day (TID) | INTRAMUSCULAR | Status: DC
  Administered 2016-04-27 – 2016-04-28 (×3): 10 mg via INTRAVENOUS
  Filled 2016-04-27 (×3): qty 1

## 2016-04-27 MED ORDER — SODIUM CHLORIDE 0.9 % IR SOLN
Status: DC | PRN
  Administered 2016-04-27: 1000 mL

## 2016-04-27 MED ORDER — PANTOPRAZOLE SODIUM 40 MG PO TBEC
40.0000 mg | DELAYED_RELEASE_TABLET | Freq: Every day | ORAL | Status: DC
  Administered 2016-04-28: 40 mg via ORAL
  Filled 2016-04-27: qty 1

## 2016-04-27 MED ORDER — BUPIVACAINE HCL (PF) 0.75 % IJ SOLN
INTRAMUSCULAR | Status: DC | PRN
  Administered 2016-04-27: 2 mL via INTRATHECAL

## 2016-04-27 MED ORDER — PROMETHAZINE HCL 25 MG/ML IJ SOLN
6.2500 mg | INTRAMUSCULAR | Status: DC | PRN
Start: 2016-04-27 — End: 2016-04-27

## 2016-04-27 MED ORDER — CHLORHEXIDINE GLUCONATE 4 % EX LIQD: 60.0000 mL | Freq: Once | CUTANEOUS | Status: DC

## 2016-04-27 MED ORDER — DIPHENHYDRAMINE HCL 12.5 MG/5ML PO ELIX: 12.5000 mg | ORAL_SOLUTION | ORAL | Status: DC | PRN

## 2016-04-27 MED ORDER — POLYETHYLENE GLYCOL 3350 17 G PO PACK
17.0000 g | PACK | Freq: Two times a day (BID) | ORAL | Status: DC
  Administered 2016-04-27 – 2016-04-28 (×2): 17 g via ORAL
  Filled 2016-04-27 (×2): qty 1

## 2016-04-27 MED ORDER — HYDROMORPHONE HCL 1 MG/ML IJ SOLN: 0.2500 mg | INTRAMUSCULAR | Status: DC | PRN

## 2016-04-27 MED ORDER — MIDAZOLAM HCL 2 MG/2ML IJ SOLN
INTRAMUSCULAR | Status: AC
  Filled 2016-04-27: qty 2

## 2016-04-27 MED ORDER — CELECOXIB 200 MG PO CAPS
200.0000 mg | ORAL_CAPSULE | Freq: Every day | ORAL | Status: DC
  Administered 2016-04-28: 200 mg via ORAL
  Filled 2016-04-27 (×2): qty 1

## 2016-04-27 MED ORDER — HYDROMORPHONE HCL 1 MG/ML IJ SOLN: 0.5000 mg | INTRAMUSCULAR | Status: DC | PRN

## 2016-04-27 MED ORDER — FENTANYL CITRATE (PF) 100 MCG/2ML IJ SOLN
INTRAMUSCULAR | Status: AC
  Filled 2016-04-27: qty 2

## 2016-04-27 MED ORDER — POTASSIUM CHLORIDE IN NACL 20-0.9 MEQ/L-% IV SOLN
INTRAVENOUS | Status: DC
  Administered 2016-04-27 – 2016-04-28 (×2): via INTRAVENOUS
  Filled 2016-04-27 (×2): qty 1000

## 2016-04-27 MED ORDER — PROPOFOL 10 MG/ML IV BOLUS
INTRAVENOUS | Status: AC
  Filled 2016-04-27: qty 20

## 2016-04-27 MED ORDER — ONDANSETRON HCL 4 MG/2ML IJ SOLN: 4.0000 mg | Freq: Four times a day (QID) | INTRAMUSCULAR | Status: DC | PRN

## 2016-04-27 MED ORDER — ONDANSETRON HCL 4 MG/2ML IJ SOLN
INTRAMUSCULAR | Status: DC | PRN
  Administered 2016-04-27: 4 mg via INTRAVENOUS

## 2016-04-27 MED ORDER — MENTHOL 3 MG MT LOZG: 1.0000 | LOZENGE | OROMUCOSAL | Status: DC | PRN

## 2016-04-27 MED ORDER — OXYCODONE HCL 5 MG PO TABS: 5.0000 mg | ORAL_TABLET | ORAL | Status: DC | PRN

## 2016-04-27 MED ORDER — PHENOL 1.4 % MT LIQD: 1.0000 | OROMUCOSAL | Status: DC | PRN

## 2016-04-27 MED ORDER — DOCUSATE SODIUM 100 MG PO CAPS
100.0000 mg | ORAL_CAPSULE | Freq: Two times a day (BID) | ORAL | Status: DC
  Administered 2016-04-27 – 2016-04-28 (×2): 100 mg via ORAL
  Filled 2016-04-27 (×2): qty 1

## 2016-04-27 MED ORDER — BUPIVACAINE-EPINEPHRINE 0.25% -1:200000 IJ SOLN
INTRAMUSCULAR | Status: DC | PRN
  Administered 2016-04-27: 30 mL

## 2016-04-27 MED ORDER — ASPIRIN EC 325 MG PO TBEC
325.0000 mg | DELAYED_RELEASE_TABLET | Freq: Every day | ORAL | Status: DC
  Administered 2016-04-28: 325 mg via ORAL
  Filled 2016-04-27: qty 1

## 2016-04-27 MED ORDER — FENTANYL CITRATE (PF) 100 MCG/2ML IJ SOLN
75.0000 ug | Freq: Once | INTRAMUSCULAR | Status: AC
  Administered 2016-04-27: 75 ug via INTRAVENOUS

## 2016-04-27 MED ORDER — PROPOFOL 500 MG/50ML IV EMUL
INTRAVENOUS | Status: DC | PRN
  Administered 2016-04-27: 20 ug/kg/min via INTRAVENOUS

## 2016-04-27 MED ORDER — BUPIVACAINE-EPINEPHRINE (PF) 0.25% -1:200000 IJ SOLN
INTRAMUSCULAR | Status: AC
  Filled 2016-04-27: qty 30

## 2016-04-27 MED ORDER — METOCLOPRAMIDE HCL 5 MG/ML IJ SOLN: 5.0000 mg | Freq: Three times a day (TID) | INTRAMUSCULAR | Status: DC | PRN

## 2016-04-27 MED ORDER — OXYCODONE HCL 5 MG PO TABS: 5.0000 mg | ORAL_TABLET | Freq: Once | ORAL | Status: DC | PRN

## 2016-04-27 MED ORDER — ONDANSETRON HCL 4 MG PO TABS: 4.0000 mg | ORAL_TABLET | Freq: Four times a day (QID) | ORAL | Status: DC | PRN

## 2016-04-27 SURGICAL SUPPLY — 69 items
BANDAGE ESMARK 6X9 LF (GAUZE/BANDAGES/DRESSINGS) ×1 IMPLANT
BENZOIN TINCTURE PRP APPL 2/3 (GAUZE/BANDAGES/DRESSINGS) ×3 IMPLANT
BLADE SAGITTAL 25.0X1.19X90 (BLADE) ×2 IMPLANT
BLADE SAGITTAL 25.0X1.19X90MM (BLADE) ×1
BLADE SAW SGTL 13X75X1.27 (BLADE) ×3 IMPLANT
BLADE SURG 10 STRL SS (BLADE) ×6 IMPLANT
BNDG ELASTIC 6X15 VLCR STRL LF (GAUZE/BANDAGES/DRESSINGS) ×3 IMPLANT
BNDG ESMARK 6X9 LF (GAUZE/BANDAGES/DRESSINGS) ×3
BOWL SMART MIX CTS (DISPOSABLE) ×3 IMPLANT
CAPT KNEE TOTAL 3 ATTUNE ×3 IMPLANT
CEMENT HV SMART SET (Cement) ×6 IMPLANT
CLOSURE STERI-STRIP 1/2X4 (GAUZE/BANDAGES/DRESSINGS) ×1
CLOSURE WOUND 1/2 X4 (GAUZE/BANDAGES/DRESSINGS) ×1
CLSR STERI-STRIP ANTIMIC 1/2X4 (GAUZE/BANDAGES/DRESSINGS) ×2 IMPLANT
COVER SURGICAL LIGHT HANDLE (MISCELLANEOUS) ×3 IMPLANT
CUFF TOURNIQUET SINGLE 34IN LL (TOURNIQUET CUFF) ×3 IMPLANT
CUFF TOURNIQUET SINGLE 44IN (TOURNIQUET CUFF) IMPLANT
DECANTER SPIKE VIAL GLASS SM (MISCELLANEOUS) ×3 IMPLANT
DRAPE EXTREMITY T 121X128X90 (DRAPE) ×3 IMPLANT
DRAPE INCISE IOBAN 66X45 STRL (DRAPES) ×3 IMPLANT
DRAPE PROXIMA HALF (DRAPES) ×3 IMPLANT
DRAPE U-SHAPE 47X51 STRL (DRAPES) ×3 IMPLANT
DRSG AQUACEL AG ADV 3.5X14 (GAUZE/BANDAGES/DRESSINGS) ×3 IMPLANT
DURAPREP 26ML APPLICATOR (WOUND CARE) ×6 IMPLANT
ELECT CAUTERY BLADE 6.4 (BLADE) ×3 IMPLANT
ELECT REM PT RETURN 9FT ADLT (ELECTROSURGICAL) ×3
ELECTRODE REM PT RTRN 9FT ADLT (ELECTROSURGICAL) ×1 IMPLANT
FACESHIELD WRAPAROUND (MASK) ×3 IMPLANT
GLOVE BIO SURGEON STRL SZ7 (GLOVE) ×3 IMPLANT
GLOVE BIOGEL PI IND STRL 7.0 (GLOVE) ×1 IMPLANT
GLOVE BIOGEL PI IND STRL 7.5 (GLOVE) ×1 IMPLANT
GLOVE BIOGEL PI INDICATOR 7.0 (GLOVE) ×2
GLOVE BIOGEL PI INDICATOR 7.5 (GLOVE) ×2
GLOVE SS BIOGEL STRL SZ 7.5 (GLOVE) ×2 IMPLANT
GLOVE SUPERSENSE BIOGEL SZ 7.5 (GLOVE) ×4
GOWN STRL REUS W/ TWL LRG LVL3 (GOWN DISPOSABLE) IMPLANT
GOWN STRL REUS W/ TWL XL LVL3 (GOWN DISPOSABLE) ×3 IMPLANT
GOWN STRL REUS W/TWL LRG LVL3 (GOWN DISPOSABLE)
GOWN STRL REUS W/TWL XL LVL3 (GOWN DISPOSABLE) ×6
HANDPIECE INTERPULSE COAX TIP (DISPOSABLE) ×2
HOOD PEEL AWAY FACE SHEILD DIS (HOOD) ×6 IMPLANT
IMMOBILIZER KNEE 22 (SOFTGOODS) ×3 IMPLANT
IMMOBILIZER KNEE 22 UNIV (SOFTGOODS) ×3 IMPLANT
KIT BASIN OR (CUSTOM PROCEDURE TRAY) ×3 IMPLANT
KIT ROOM TURNOVER OR (KITS) ×3 IMPLANT
MANIFOLD NEPTUNE II (INSTRUMENTS) ×3 IMPLANT
MARKER SKIN DUAL TIP RULER LAB (MISCELLANEOUS) ×3 IMPLANT
NEEDLE 18GX1X1/2 (RX/OR ONLY) (NEEDLE) ×3 IMPLANT
NS IRRIG 1000ML POUR BTL (IV SOLUTION) ×3 IMPLANT
PACK TOTAL JOINT (CUSTOM PROCEDURE TRAY) ×3 IMPLANT
PAD ARMBOARD 7.5X6 YLW CONV (MISCELLANEOUS) ×3 IMPLANT
SET HNDPC FAN SPRY TIP SCT (DISPOSABLE) ×1 IMPLANT
STRIP CLOSURE SKIN 1/2X4 (GAUZE/BANDAGES/DRESSINGS) ×2 IMPLANT
SUCTION FRAZIER HANDLE 10FR (MISCELLANEOUS) ×2
SUCTION TUBE FRAZIER 10FR DISP (MISCELLANEOUS) ×1 IMPLANT
SUT MNCRL AB 3-0 PS2 18 (SUTURE) ×6 IMPLANT
SUT VIC AB 0 CT1 27 (SUTURE) ×4
SUT VIC AB 0 CT1 27XBRD ANBCTR (SUTURE) ×2 IMPLANT
SUT VIC AB 1 CT1 27 (SUTURE) ×4
SUT VIC AB 1 CT1 27XBRD ANBCTR (SUTURE) ×2 IMPLANT
SUT VIC AB 2-0 CT1 27 (SUTURE) ×4
SUT VIC AB 2-0 CT1 TAPERPNT 27 (SUTURE) ×2 IMPLANT
SYR 30ML LL (SYRINGE) ×3 IMPLANT
TOWEL OR 17X24 6PK STRL BLUE (TOWEL DISPOSABLE) ×3 IMPLANT
TOWEL OR 17X26 10 PK STRL BLUE (TOWEL DISPOSABLE) ×3 IMPLANT
TRAY FOLEY CATH 16FR SILVER (SET/KITS/TRAYS/PACK) ×3 IMPLANT
TUBE CONNECTING 12'X1/4 (SUCTIONS) ×1
TUBE CONNECTING 12X1/4 (SUCTIONS) ×2 IMPLANT
YANKAUER SUCT BULB TIP NO VENT (SUCTIONS) ×3 IMPLANT

## 2016-04-27 NOTE — Op Note (Signed)
MRN:     [id number] DOB/AGE:    80/17/1931 / 80 y.o.       OPERATIVE REPORT    DATE OF PROCEDURE:  04/27/2016       PREOPERATIVE DIAGNOSIS:  PRIMARY LOCALIZED OA LEFT KNEE      Estimated body mass index is 28.19 kg/m as calculated from the following:   Height as of this encounter: 5\' 8"  (1.727 m).   Weight as of this encounter: 84.1 kg (185 lb 6 oz).                                                        POSTOPERATIVE DIAGNOSIS:  SAME                                                                     PROCEDURE:  Procedure(s): LEFT TOTAL KNEE ARTHROPLASTY Using Depuy Attune RP implants #8 Femur, #7Tibia, 8mm  RP bearing, 38 Patella     SURGEON: Airyonna Franklyn A    ASSISTANT:  Kirstin Shepperson PA-C   (Present and scrubbed throughout the case, critical for assistance with exposure, retraction, instrumentation, and closure.)         ANESTHESIA: Spinal with Adductor Nerve Block     TOURNIQUET TIME: 75min   COMPLICATIONS:  None     SPECIMENS: None   INDICATIONS FOR PROCEDURE: The patient has  OA LEFT KNEE, varus deformities, XR shows bone on bone arthritis. Patient has failed all conservative measures including anti-inflammatory medicines, narcotics, attempts at  exercise and weight loss, cortisone injections and viscosupplementation.  Risks and benefits of surgery have been discussed, questions answered.   DESCRIPTION OF PROCEDURE: The patient identified by armband, received  right femoral nerve block and IV antibiotics, [facility]. Patient taken to the operating room, appropriate anesthetic  monitors were attached General endotracheal anesthesia induced with  the patient in supine position, Foley catheter was inserted. Tourniquet  applied high to the operative thigh. Lateral post and foot positioner  applied to the table, the lower extremity was then prepped and draped  in usual sterile fashion from the ankle to the tourniquet. Time-out procedure was  performed. The limb was wrapped with an Esmarch bandage and the tourniquet inflated to 365 mmHg. We began the operation by making the anterior midline incision starting at handbreadth above the patella going over the patella 1 cm medial to and  4 cm distal to the tibial tubercle. Small bleeders in the skin and the  subcutaneous tissue identified and cauterized. Transverse retinaculum was incised and reflected medially and a medial parapatellar arthrotomy was accomplished. the patella was everted and theprepatellar fat pad resected. The superficial medial collateral  ligament was then elevated from anterior to posterior along the proximal  flare of the tibia and anterior half of the menisci resected. The knee was hyperflexed exposing bone on bone arthritis. Peripheral and notch osteophytes as well as the cruciate ligaments were then resected. We continued to  work our way around posteriorly along the proximal tibia, and externally  rotated the tibia subluxing it  out from underneath the femur. A McHale  retractor was placed through the notch and a lateral Hohmann retractor  placed, and we then drilled through the proximal tibia in line with the  axis of the tibia followed by an intramedullary guide rod and 2-degree  posterior slope cutting guide. The tibial cutting guide was pinned into place  allowing resection of 4 mm of bone medially and about 6 mm of bone  laterally because of her varus deformity. Satisfied with the tibial resection, we then  entered the distal femur 2 mm anterior to the PCL origin with the  intramedullary guide rod and applied the distal femoral cutting guide  set at 11mm, with 5 degrees of valgus. This was pinned along the  epicondylar axis. At this point, the distal femoral cut was accomplished without difficulty. We then sized for a #8 femoral component and pinned the guide in 3 degrees of external rotation.The chamfer cutting guide was pinned into place. The anterior,  posterior, and chamfer cuts were accomplished without difficulty followed by  the  RP box cutting guide and the box cut. We also removed posterior osteophytes from the posterior femoral condyles. At this  time, the knee was brought into full extension. We checked our  extension and flexion gaps and found them symmetric at 8mm.  The patella thickness measured at 25 mm. We set the cutting guide at 15 and removed the posterior 9.5-10 mm  of the patella sized for 38 button and drilled the lollipop. The knee  was then once again hyperflexed exposing the proximal tibia. We sized for a #7 tibial base plate, applied the smokestack and the conical reamer followed by the the Delta fin keel punch. We then hammered into place the  RP trial femoral component, inserted a 1 trial bearing, trial patellar button, and took the knee through range of motion from 0-130 degrees. No thumb pressure was required for patellar  tracking. At this point, all trial components were removed, a double batch of DePuy HV cement  was mixed and applied to all bony metallic mating surfaces except for the posterior condyles of the femur itself. In order, we  hammered into place the tibial tray and removed excess cement, the femoral component and removed excess cement, a 8mm  RP bearing  was inserted, and the knee brought to full extension with compression.  The patellar button was clamped into place, and excess cement  removed. While the cement cured the wound was irrigated out with normal saline solution pulse lavage.. Ligament stability and patellar tracking were checked and found to be excellent.. The parapatellar arthrotomy was closed with  #1 Vicryl suture. The subcutaneous tissue with 0 and 2-0 undyed  Vicryl suture, and 4-0 Monocryl.. A dressing of Aquaseal,  4 x 4, dressing sponges, Webril, and Ace wrap applied. Needle and sponge count were correct times 2.The patient awakened, extubated, and taken to recovery room without  difficulty. Vascular status was normal, pulses 2+ and symmetric.   Danny [name] A 04/27/2016, 10:51 AM

## 2016-04-27 NOTE — Anesthesia Procedure Notes (Signed)
Spinal  Patient location during procedure: OR Start time: 04/27/2016 9:03 AM End time: 04/27/2016 9:09 AM Staffing Anesthesiologist: Gaynelle AduFITZGERALD, Edythe Riches Performed: anesthesiologist  Preanesthetic Checklist Completed: patient identified, surgical consent, pre-op evaluation, timeout performed, IV checked, risks and benefits discussed and monitors and equipment checked Spinal Block Patient position: sitting Prep: Betadine Patient monitoring: cardiac monitor, continuous pulse ox and blood pressure Approach: midline Location: L3-4 Injection technique: single-shot Needle Needle type: Pencan  Needle gauge: 24 G Needle length: 9 cm Assessment Sensory level: T8 Additional Notes Functioning IV was confirmed and monitors were applied. Sterile prep and drape, including hand hygiene and sterile gloves were used. The patient was positioned and the spine was prepped. The skin was anesthetized with lidocaine.  Free flow of clear CSF was obtained prior to injecting local anesthetic into the CSF.  The spinal needle aspirated freely following injection.  The needle was carefully withdrawn.  The patient tolerated the procedure well.

## 2016-04-27 NOTE — Evaluation (Signed)
Physical Therapy Evaluation Patient Details Name: Danny DarkBobby M Schreifels MRN: [id number] DOB: 07/29/1930 Today's Date: 04/27/2016   History of Present Illness  Patient is an 82 y/o male admitted s/p L TKA  Clinical Impression  Patient presents with decreased mobility due to deficits listed in PT problem list.  He will benefit from skilled PT in the acute setting to allow return home with family support and follow up HHPT.     Follow Up Recommendations Home health PT    Equipment Recommendations  None recommended by PT    Recommendations for Other Services       Precautions / Restrictions Precautions Precautions: Fall Required Braces or Orthoses: Knee Immobilizer - Left Restrictions Weight Bearing Restrictions: Yes LLE Weight Bearing: Weight bearing as tolerated      Mobility  Bed Mobility Overal bed mobility: Needs Assistance Bed Mobility: Supine to Sit     Supine to sit: Supervision     General bed mobility comments: for safety  Transfers Overall transfer level: Needs assistance Equipment used: Rolling walker (2 wheeled) Transfers: Sit to/from Stand Sit to Stand: Min assist         General transfer comment: cues for hand placement and technique  Ambulation/Gait Ambulation/Gait assistance: Min guard Ambulation Distance (Feet): 100 Feet Assistive device: Rolling walker (2 wheeled) Gait Pattern/deviations: Step-through pattern;Decreased stride length     General Gait Details: able to tolerate weight without pain on L LE, noted no antalgia with gait, patient denies increased pain with weight bearing  Stairs            Wheelchair Mobility    Modified Rankin (Stroke Patients Only)       Balance Overall balance assessment: Needs assistance   Sitting balance-Leahy Scale: Good       Standing balance-Leahy Scale: Fair                               Pertinent Vitals/Pain Pain Assessment: Faces Faces Pain Scale: Hurts little more Pain  Location: L knee above knee cap Pain Intervention(s): Monitored during session;Repositioned    Home Living Family/patient expects to be discharged to:: Private residence Living Arrangements: Children;Spouse/significant other (hired caregiver and daughter to assist) Available Help at Discharge: Family;Available 24 hours/day;Personal care attendant Type of Home: House Home Access: Level entry     Home Layout: One level Home Equipment: Walker - 2 wheels;Grab bars - tub/shower;Shower seat;Bedside commode      Prior Function Level of Independence: Independent               Hand Dominance        Extremity/Trunk Assessment               Lower Extremity Assessment: LLE deficits/detail   LLE Deficits / Details: AROM ankle WFL, knee about 80 degrees in supine, positive SLR without quad lag     Communication   Communication: No difficulties  Cognition Arousal/Alertness: Awake/alert Behavior During Therapy: WFL for tasks assessed/performed Overall Cognitive Status: Within Functional Limits for tasks assessed                      General Comments      Exercises Total Joint Exercises Ankle Circles/Pumps: AROM;Both;10 reps;Supine Quad Sets: AROM;Left;Both;10 reps;Supine Heel Slides: AROM;Left;10 reps;Supine Straight Leg Raises: AROM;Left;10 reps;Supine      Assessment/Plan    PT Assessment Patient needs continued PT services  PT Diagnosis Difficulty walking;Acute pain  PT Problem List Decreased knowledge of use of DME;Decreased strength;Decreased mobility;Pain;Decreased safety awareness  PT Treatment Interventions DME instruction;Gait training;Functional mobility training;Therapeutic exercise;Therapeutic activities;Patient/family education   PT Goals (Current goals can be found in the Care Plan section) Acute Rehab PT Goals Patient Stated Goal: To go home PT Goal Formulation: With patient Time For Goal Achievement: 04/29/16 Potential to Achieve Goals:  Good    Frequency 7X/week   Barriers to discharge        Co-evaluation               End of Session Equipment Utilized During Treatment: Gait belt Activity Tolerance: Patient tolerated treatment well Patient left: in chair;with call bell/phone within reach;with chair alarm set           Time: [id number] PT Time Calculation (min) (ACUTE ONLY): 23 min   Charges:   PT Evaluation $PT Eval Moderate Complexity: 1 Procedure PT Treatments $Gait Training: 8-22 mins   PT G CodesElray Mcgregor:        Cynthia Wynn 04/27/2016, 5:11 PM  Sheran Lawlessyndi Wynn, PT [phone] 04/27/2016

## 2016-04-27 NOTE — Progress Notes (Signed)
Orthopedic Tech Progress Note Patient Details:  Danny [name] 11/20/1929 [id number]  CPM Left Knee CPM Left Knee: On Left Knee Flexion (Degrees): 90 Left Knee Extension (Degrees): 0 Additional Comments: Trapeze bar and foot roll   Danny [name] 04/27/2016, 11:35 AM

## 2016-04-27 NOTE — Anesthesia Procedure Notes (Signed)
Anesthesia Regional Block:  Adductor canal block  Pre-Anesthetic Checklist: ,, timeout performed, Correct Patient, Correct Site, Correct Laterality, Correct Procedure, Correct Position, site marked, Risks and benefits discussed,  Surgical consent,  Pre-op evaluation,  At surgeon's request and post-op pain management  Laterality: Left  Prep: chloraprep       Needles:  Injection technique: Single-shot  Needle Type: Stimiplex     Needle Length: 5cm 5 cm Needle Gauge: 21 G    Additional Needles:  Procedures: ultrasound guided (picture in chart) Adductor canal block Narrative:  Injection made incrementally with aspirations every 5 mL.  Performed by: Personally  Anesthesiologist: Pharrah Rottman  Additional Notes: Risks, benefits and alternative to block explained extensively.  Patient tolerated procedure well, without complications.      

## 2016-04-27 NOTE — Interval H&P Note (Signed)
History and Physical Interval Note:  04/27/2016 8:56 AM  Danny [name]  has presented today for surgery, with the diagnosis of OA LEFT KNEE  The various methods of treatment have been discussed with the patient and family. After consideration of risks, benefits and other options for treatment, the patient has consented to  Procedure(s): LEFT TOTAL KNEE ARTHROPLASTY (Left) as a surgical intervention .  The patient's history has been reviewed, patient examined, no change in status, stable for surgery.  I have reviewed the patient's chart and labs.  Questions were answered to the patient's satisfaction.     Salvatore MarvelWAINER,Ramar Nobrega A

## 2016-04-27 NOTE — Anesthesia Postprocedure Evaluation (Signed)
Anesthesia Post Note  Patient: Danny [name]  Procedure(s) Performed: Procedure(s) (LRB): LEFT TOTAL KNEE ARTHROPLASTY (Left)  Patient location during evaluation: PACU Anesthesia Type: Spinal Level of consciousness: oriented and awake and alert Pain management: pain level controlled Vital Signs Assessment: post-procedure vital signs reviewed and stable Respiratory status: spontaneous breathing, respiratory function stable and patient connected to nasal cannula oxygen Cardiovascular status: blood pressure returned to baseline and stable Postop Assessment: no headache, no backache and spinal receding Anesthetic complications: no    Last Vitals:  Vitals:   04/27/16 0855 04/27/16 1130  BP: 138/75   Pulse: 67   Resp: 20   Temp:  36.4 C    Last Pain:  Vitals:   04/27/16 1130  TempSrc:   PainSc: Asleep                 Reino KentJudd, Marquis Down J

## 2016-04-27 NOTE — Anesthesia Procedure Notes (Signed)
Procedure Name: MAC Date/Time: 04/27/2016 9:08 AM Performed by: Glo HerringLEE, Rosalie Gelpi B Pre-anesthesia Checklist: Patient identified, Emergency Drugs available, Suction available, Timeout performed and Patient being monitored Patient Re-evaluated:Patient Re-evaluated prior to inductionOxygen Delivery Method: Simple face mask Intubation Type: IV induction Placement Confirmation: breath sounds checked- equal and bilateral and positive ETCO2 Dental Injury: Teeth and Oropharynx as per pre-operative assessment

## 2016-04-27 NOTE — Transfer of Care (Signed)
Immediate Anesthesia Transfer of Care Note  Patient: Danny [name]  Procedure(s) Performed: Procedure(s): LEFT TOTAL KNEE ARTHROPLASTY (Left)  Patient Location: PACU  Anesthesia Type:Spinal and MAC combined with regional for post-op pain  Level of Consciousness: awake and alert   Airway & Oxygen Therapy: Patient Spontanous Breathing and Patient connected to face mask oxygen  Post-op Assessment: Report given to RN and Post -op Vital signs reviewed and stable  Post vital signs: Reviewed and stable  Last Vitals:  Vitals:   04/27/16 0850 04/27/16 0855  BP: 128/83 138/75  Pulse: 60 67  Resp: 14 20  Temp:      Last Pain:  Vitals:   04/27/16 0744  TempSrc:   PainSc: 6       Patients Stated Pain Goal: 2 (04/27/16 0744)  Complications: No apparent anesthesia complications

## 2016-04-27 NOTE — Anesthesia Preprocedure Evaluation (Addendum)
Anesthesia Evaluation  Patient identified by MRN, date of birth, ID band Patient awake    Reviewed: Allergy & Precautions, H&P , NPO status , Patient's Chart, lab work & pertinent test results  History of Anesthesia Complications Negative for: history of anesthetic complications  Airway Mallampati: II  TM Distance: >3 FB Neck ROM: full    Dental  (+) Partial Upper   Pulmonary neg pulmonary ROS,    Pulmonary exam normal breath sounds clear to auscultation       Cardiovascular hypertension, Normal cardiovascular exam Rhythm:regular Rate:Normal     Neuro/Psych negative neurological ROS     GI/Hepatic Neg liver ROS, PUD, GERD  ,  Endo/Other  negative endocrine ROS  Renal/GU negative Renal ROS     Musculoskeletal   Abdominal   Peds  Hematology negative hematology ROS (+)   Anesthesia Other Findings   Reproductive/Obstetrics negative OB ROS                            Anesthesia Physical Anesthesia Plan  ASA: II  Anesthesia Plan: Spinal and Regional   Post-op Pain Management:    Induction: Intravenous  Airway Management Planned: Simple Face Mask  Additional Equipment:   Intra-op Plan:   Post-operative Plan:   Informed Consent: I have reviewed the patients History and Physical, chart, labs and discussed the procedure including the risks, benefits and alternatives for the proposed anesthesia with the patient or authorized representative who has indicated his/her understanding and acceptance.   Dental Advisory Given  Plan Discussed with: Anesthesiologist, CRNA and Surgeon  Anesthesia Plan Comments:        Anesthesia Quick Evaluation

## 2016-04-28 ENCOUNTER — Encounter (HOSPITAL_COMMUNITY): Payer: Self-pay | Admitting: Orthopedic Surgery

## 2016-04-28 LAB — CBC
HEMATOCRIT: 38 % — AB (ref 39.0–52.0)
Hemoglobin: 12.3 g/dL — ABNORMAL LOW (ref 13.0–17.0)
MCH: 29 pg (ref 26.0–34.0)
MCHC: 32.4 g/dL (ref 30.0–36.0)
MCV: 89.6 fL (ref 78.0–100.0)
PLATELETS: 160 10*3/uL (ref 150–400)
RBC: 4.24 MIL/uL (ref 4.22–5.81)
RDW: 13.2 % (ref 11.5–15.5)
WBC: 18 10*3/uL — AB (ref 4.0–10.5)

## 2016-04-28 LAB — BASIC METABOLIC PANEL
Anion gap: 5 (ref 5–15)
BUN: 20 mg/dL (ref 6–20)
CHLORIDE: 107 mmol/L (ref 101–111)
CO2: 26 mmol/L (ref 22–32)
CREATININE: 1.03 mg/dL (ref 0.61–1.24)
Calcium: 9.1 mg/dL (ref 8.9–10.3)
GFR calc Af Amer: >60 mL/min (ref >60)
Glucose, Bld: 178 mg/dL — ABNORMAL HIGH (ref 65–99)
POTASSIUM: 4.2 mmol/L (ref 3.5–5.1)
Sodium: 138 mmol/L (ref 135–145)

## 2016-04-28 MED ORDER — ACETAMINOPHEN 325 MG PO TABS: 650.0000 mg | ORAL_TABLET | Freq: Four times a day (QID) | ORAL | Status: DC | PRN

## 2016-04-28 MED ORDER — ASPIRIN 325 MG PO TBEC: DELAYED_RELEASE_TABLET | ORAL | 0 refills | Status: DC

## 2016-04-28 MED ORDER — POLYETHYLENE GLYCOL 3350 17 G PO PACK: PACK | ORAL | 0 refills | Status: DC

## 2016-04-28 MED ORDER — TAMSULOSIN HCL 0.4 MG PO CAPS: 0.4000 mg | ORAL_CAPSULE | Freq: Every day | ORAL | 0 refills | Status: DC

## 2016-04-28 MED ORDER — OXYCODONE HCL 5 MG PO TABS: ORAL_TABLET | ORAL | 0 refills | Status: DC

## 2016-04-28 MED ORDER — TAMSULOSIN HCL 0.4 MG PO CAPS
0.4000 mg | ORAL_CAPSULE | Freq: Every day | ORAL | Status: DC
  Administered 2016-04-28: 0.4 mg via ORAL
  Filled 2016-04-28: qty 1

## 2016-04-28 MED ORDER — DOCUSATE SODIUM 100 MG PO CAPS: ORAL_CAPSULE | ORAL | 0 refills | Status: DC

## 2016-04-28 NOTE — Care Management Note (Signed)
Case Management Note  Patient Details  Name: Cherie DarkBobby M Chicas MRN: [id number] Date of Birth: 03/24/1930  Subjective/Objective:   82 yr old male s/p left total knee arthroplasty.               Action/Plan: Case manager spoke with patient concerning home health and DME needs. Choice was offered for Home Health agency, referral was called to [facility]. Patient states he has rolling walker and 3in1 at home. He will have family support at discharge.    Expected Discharge Date:   04/28/16               Expected Discharge Plan:  Home w Home Health Services  In-House Referral:     Discharge planning Services  CM Consult  Post Acute Care Choice:  Home Health Choice offered to:  Patient  DME Arranged:  N/A DME Agency:  NA  HH Arranged:  PT HH Agency:  [facility]  Status of Service:  Completed, signed off  If discussed at Long Length of Stay Meetings, dates discussed:    Additional Comments:  Durenda GuthrieBrady, Kahley Leib Naomi, RN 04/28/2016, 10:48 AM

## 2016-04-28 NOTE — Progress Notes (Signed)
Physical Therapy Treatment Patient Details Name: Danny [name] MRN: [id number] DOB: 08/25/1930 Today's Date: 04/28/2016    History of Present Illness Patient is an 82 y/o male admitted s/p L TKA    PT Comments    Patient progressing with mobility and [facility] and family/caregiver assist.  Still needing cues for walker safety, but feel he should progress to cane soon.  Cautioned him to stay close to walker to avoid falls.    Follow Up Recommendations  Home health PT     Equipment Recommendations  None recommended by PT    Recommendations for Other Services       Precautions / Restrictions Precautions Precautions: Fall Restrictions Weight Bearing Restrictions: Yes LLE Weight Bearing: Weight bearing as tolerated    Mobility  Bed Mobility               General bed mobility comments: up in chair  Transfers Overall transfer level: Needs assistance Equipment used: Rolling walker (2 wheeled) Transfers: Sit to/from Stand Sit to Stand: Supervision         General transfer comment: for safety with cues on walker use  Ambulation/Gait Ambulation/Gait assistance: Supervision Ambulation Distance (Feet): 240 Feet Assistive device: Rolling walker (2 wheeled) Gait Pattern/deviations: Step-through pattern;Decreased stance time - left     General Gait Details: cues for safety with walker positioning and for posture   Stairs Stairs: Yes Stairs assistance: Supervision Stair Management: With walker Number of Stairs: 1 (practiced x 2) General stair comments: cues for technique and walker safety  Wheelchair Mobility    Modified Rankin (Stroke Patients Only)       Balance Overall balance assessment: Needs assistance   Sitting balance-Leahy Scale: Good     Standing balance support: No upper extremity supported Standing balance-Leahy Scale: Fair                      Cognition Arousal/Alertness: Awake/alert Behavior  During Therapy: WFL for tasks assessed/performed Overall Cognitive Status: Within Functional Limits for tasks assessed                      Exercises Total Joint Exercises Ankle Circles/Pumps: AROM;Both;10 reps;Seated Quad Sets: AROM;Left;10 reps;Seated Towel Squeeze: AROM;Both;10 reps;Seated Short Arc Quad: AROM;Left;10 reps;Seated Heel Slides: AROM;Left;10 reps;Seated Hip ABduction/ADduction: AROM;Left;10 reps;Seated Straight Leg Raises: AROM;Left;10 reps;Seated Goniometric ROM: 93    General Comments General comments (skin integrity, edema, etc.): Educated on and practiced car transfers      Pertinent Vitals/Pain Faces Pain Scale: Hurts little more Pain Location: L knee with mobility Pain Descriptors / Indicators: Sore Pain Intervention(s): Monitored during session;Repositioned;Ice applied    Home Living                      Prior Function            PT Goals (current goals can now be found in the care plan section) Progress towards PT goals: Progressing toward goals    Frequency  7X/week    PT Plan Current plan remains appropriate    Co-evaluation             End of Session   Activity Tolerance: Patient tolerated treatment well Patient left: in chair;with call bell/phone within reach     Time: 0955-1020 PT Time Calculation (min) (ACUTE ONLY): 25 min  Charges:  $Gait Training: 8-22 mins $Therapeutic Exercise: 8-22 mins  G CodesElray [name]:      Danny [name] 04/28/2016, 10:39 AM  Sheran Lawlessyndi Jaxsen Bernhart, PT [phone] 04/28/2016

## 2016-04-28 NOTE — Progress Notes (Signed)
Orthopedic Tech Progress Note Patient Details:  Danny [name] 10/22/1929 [id number]  Patient ID: Danny [name], male   DOB: 05/15/1930, 82 y.o.   MRN: [id number] Applied cpm 0-60  Trinna PostMartinez, Laydon Martis J 04/28/2016, 6:18 AM

## 2016-04-28 NOTE — Progress Notes (Signed)
OT Cancellation Note  Patient Details Name: Danny DarkBobby M Bodkins MRN: [id number] DOB: 01/09/1930   Cancelled Treatment:    Reason Eval/Treat Not Completed: OT screened, no needs identified, will sign off. Pt has all necessary DME at home as well as family support  Galen ManilaSpencer, Shivon Hackel Jeanette 04/28/2016, 11:57 AM

## 2016-04-28 NOTE — Discharge Summary (Signed)
Patient ID: Danny [name] MRN: [id number] DOB/AGE: 80/01/1930 80 y.o.  Admit date: 04/27/2016 Discharge date: 04/28/2016  Admission Diagnoses:  Principal Problem:   Primary localized osteoarthritis of left knee Active Problems:   GERD (gastroesophageal reflux disease)   Hyperlipidemia   Discharge Diagnoses:  Same  Past Medical History:  Diagnosis Date  . Essential hypertension    a. Dr. Welton FlakesKhan in [place].  Previously on antihypertensive, which was later d/c'd.  Marland Kitchen. GERD (gastroesophageal reflux disease)   . Hyperlipidemia   . Primary localized osteoarthritis of left knee    a. L knee, pending TKA.  . PUD (peptic ulcer disease)    a. Remote h/o coffee grounds emesis followed by GI eval.  On Nexium since.    Surgeries: Procedure(s): LEFT TOTAL KNEE ARTHROPLASTY on 04/27/2016   Consultants:   Discharged Condition: Improved  Hospital Course: Danny [name] is an 82 y.o. male who was admitted 04/27/2016 for operative treatment ofPrimary localized osteoarthritis of left knee. Patient has severe unremitting pain that affects sleep, daily activities, and work/hobbies. After pre-op clearance the patient was taken to the operating room on 04/27/2016 and underwent  Procedure(s): LEFT TOTAL KNEE ARTHROPLASTY.    Patient was given perioperative antibiotics:  Anti-infectives    Start     Dose/Rate Route Frequency Ordered Stop   04/27/16 1500  ceFAZolin (ANCEF) IVPB 2g/100 mL premix     2 g 200 mL/hr over 30 Minutes Intravenous Every 6 hours 04/27/16 1340 04/27/16 2130   04/27/16 0000  ceFAZolin (ANCEF) IVPB 2g/100 mL premix     2 g 200 mL/hr over 30 Minutes Intravenous To ShortStay Surgical 04/26/16 0913 04/27/16 0913       Patient was given sequential compression devices, early ambulation, and chemoprophylaxis to prevent DVT.  Patient benefited maximally from hospital stay and there were no complications.    Recent vital signs:  Patient Vitals for the past 24  hrs:  BP Temp Temp src Pulse Resp SpO2  04/28/16 0539 118/65 97.7 F (36.5 C) Oral 70 16 93 %  04/28/16 0013 109/76 98.1 F (36.7 C) Oral 69 16 97 %  04/27/16 2110 107/69 97.7 F (36.5 C) Oral 68 15 94 %  04/27/16 1250 118/81 - - (!) 58 12 100 %  04/27/16 1235 124/89 97.5 F (36.4 C) - (!) 57 13 98 %  04/27/16 1220 132/85 - - (!) 58 (!) 9 99 %  04/27/16 1205 130/79 - - (!) 53 10 99 %  04/27/16 1150 124/84 - - (!) 57 14 99 %  04/27/16 1135 122/84 - - (!) 55 12 95 %  04/27/16 1130 - 97.5 F (36.4 C) - - - -     Recent laboratory studies:   Recent Labs  04/28/16 0456  WBC 18.0*  HGB 12.3*  HCT 38.0*  PLT 160  NA 138  K 4.2  CL 107  CO2 26  BUN 20  CREATININE 1.03  GLUCOSE 178*  CALCIUM 9.1     Discharge Medications:     Medication List    TAKE these medications   acetaminophen 325 MG tablet Commonly known as:  TYLENOL Take 2 tablets (650 mg total) by mouth every 6 (six) hours as needed for mild pain (or Fever >/= 101).   aspirin 325 MG EC tablet 1 tab a day for the next 30 days to prevent blood clots   docusate sodium 100 MG capsule Commonly known as:  COLACE 1 tab 2 times a day while  on narcotics.  STOOL SOFTENER   esomeprazole 40 MG capsule Commonly known as:  NEXIUM Take 40 mg by mouth daily at 12 noon.   oxyCODONE 5 MG immediate release tablet Commonly known as:  Oxy IR/ROXICODONE 1-2 tablets every 4-6 hrs as needed for pain   polyethylene glycol packet Commonly known as:  MIRALAX / GLYCOLAX 17grams in 6 oz of water twice a day until bowel movement.  LAXITIVE.  Restart if two days since last bowel movement   pravastatin 40 MG tablet Commonly known as:  PRAVACHOL Take 40 mg by mouth daily before breakfast.   tamsulosin 0.4 MG Caps capsule Commonly known as:  FLOMAX Take 1 capsule (0.4 mg total) by mouth daily after breakfast.       Diagnostic Studies: No results found.  Disposition: Final discharge disposition not confirmed  Discharge  Instructions    CPM    Complete by:  As directed   Continuous passive motion machine (CPM):      Use the CPM from 0 to 90 for 6 hours per day.       You may break it up into 2 or 3 sessions per day.      Use CPM for 2 weeks or until you are told to stop.   Call MD / Call 911    Complete by:  As directed   If you experience chest pain or shortness of breath, CALL 911 and be transported to the hospital emergency room.  If you develope a fever above 101 F, pus (white drainage) or increased drainage or redness at the wound, or calf pain, call your surgeon's office.   Change dressing    Complete by:  As directed   Change the gauze dressing daily with sterile 4 x 4 inch gauze and apply TED hose.  DO NOT REMOVE BANDAGE OVER SURGICAL INCISION.  WASH WHOLE LEG INCLUDING OVER THE WATERPROOF BANDAGE WITH SOAP AND WATER EVERY DAY.   Constipation Prevention    Complete by:  As directed   Drink plenty of fluids.  Prune juice may be helpful.  You may use a stool softener, such as Colace (over the counter) 100 mg twice a day.  Use MiraLax (over the counter) for constipation as needed.   Diet - low sodium heart healthy    Complete by:  As directed   Discharge instructions    Complete by:  As directed   INSTRUCTIONS AFTER JOINT REPLACEMENT   Remove items at home which could result in a fall. This includes throw rugs or furniture in walking pathways ICE to the affected joint every three hours while awake for 30 minutes at a time, for at least the first 3-5 days, and then as needed for pain and swelling.  Continue to use ice for pain and swelling. You may notice swelling that will progress down to the foot and ankle.  This is normal after surgery.  Elevate your leg when you are not up walking on it.   Continue to use the breathing machine you got in the hospital (incentive spirometer) which will help keep your temperature down.  It is common for your temperature to cycle up and down following surgery, especially at  night when you are not up moving around and exerting yourself.  The breathing machine keeps your lungs expanded and your temperature down.   DIET:  As you were doing prior to hospitalization, we recommend a well-balanced diet.  DRESSING / WOUND CARE / SHOWERING  Keep the  surgical dressing until follow up.  The dressing is water proof, so you can shower without any extra covering.  IF THE DRESSING FALLS OFF or the wound gets wet inside, change the dressing with sterile gauze.  Please use good hand washing techniques before changing the dressing.  Do not use any lotions or creams on the incision until instructed by your surgeon.    ACTIVITY  Increase activity slowly as tolerated, but follow the weight bearing instructions below.   No driving for 6 weeks or until further direction given by your physician.  You cannot drive while taking narcotics.  No lifting or carrying greater than 10 lbs. until further directed by your surgeon. Avoid periods of inactivity such as sitting longer than an hour when not asleep. This helps prevent blood clots.  You may return to work once you are authorized by your doctor.     WEIGHT BEARING   Weight bearing as tolerated with assist device (walker, cane, etc) as directed, use it as long as suggested by your surgeon or therapist, typically at least 2-3 weeks.   EXERCISES  Results after joint replacement surgery are often greatly improved when you follow the exercise, range of motion and muscle strengthening exercises prescribed by your doctor. Safety measures are also important to protect the joint from further injury. Any time any of these exercises cause you to have increased pain or swelling, decrease what you are doing until you are comfortable again and then slowly increase them. If you have problems or questions, call your caregiver or physical therapist for advice.   Rehabilitation is important following a joint replacement. After just a few days of  immobilization, the muscles of the leg can become weakened and shrink (atrophy).  These exercises are designed to build up the tone and strength of the thigh and leg muscles and to improve motion. Often times heat used for twenty to thirty minutes before working out will loosen up your tissues and help with improving the range of motion but do not use heat for the first two weeks following surgery (sometimes heat can increase post-operative swelling).   These exercises can be done on a training (exercise) mat, on the floor, on a table or on a bed. Use whatever works the best and is most comfortable for you.    Use music or television while you are exercising so that the exercises are a pleasant break in your day. This will make your life better with the exercises acting as a break in your routine that you can look forward to.   Perform all exercises about fifteen times, three times per day or as directed.  You should exercise both the operative leg and the other leg as well.   Exercises include:  Quad Sets - Tighten up the muscle on the front of the thigh (Quad) and hold for 5-10 seconds.   Straight Leg Raises - With your knee straight (if you were given a brace, keep it on), lift the leg to 60 degrees, hold for 3 seconds, and slowly lower the leg.  Perform this exercise against resistance later as your leg gets stronger.  Leg Slides: Lying on your back, slowly slide your foot toward your buttocks, bending your knee up off the floor (only go as far as is comfortable). Then slowly slide your foot back down until your leg is flat on the floor again.  Angel Wings: Lying on your back spread your legs to the side as far apart as  you can without causing discomfort.  Hamstring Strength:  Lying on your back, push your heel against the floor with your leg straight by tightening up the muscles of your buttocks.  Repeat, but this time bend your knee to a comfortable angle, and push your heel against the floor.  You  may put a pillow under the heel to make it more comfortable if necessary.   A rehabilitation program following joint replacement surgery can speed recovery and prevent re-injury in the future due to weakened muscles. Contact your doctor or a physical therapist for more information on knee rehabilitation.    CONSTIPATION  Constipation is defined medically as fewer than three stools per week and severe constipation as less than one stool per week.  Even if you have a regular bowel pattern at home, your normal regimen is likely to be disrupted due to multiple reasons following surgery.  Combination of anesthesia, postoperative narcotics, change in appetite and fluid intake all can affect your bowels.   YOU MUST use at least one of the following options; they are listed in order of increasing strength to get the job done.  They are all available over the counter, and you may need to use some, POSSIBLY even all of these options:    Drink plenty of fluids (prune juice may be helpful) and high fiber foods Colace 100 mg by mouth twice a day  Senokot for constipation as directed and as needed Dulcolax (bisacodyl), take with full glass of water  Miralax (polyethylene glycol) once or twice a day as needed.  If you have tried all these things and are unable to have a bowel movement in the first 3-4 days after surgery call either your surgeon or your primary doctor.    If you experience loose stools or diarrhea, hold the medications until you stool forms back up.  If your symptoms do not get better within 1 week or if they get worse, check with your doctor.  If you experience "the worst abdominal pain ever" or develop nausea or vomiting, please contact the office immediately for further recommendations for treatment.   ITCHING:  If you experience itching with your medications, try taking only a single pain pill, or even half a pain pill at a time.  You can also use Benadryl over the counter for itching or  also to help with sleep.   TED HOSE STOCKINGS:  Use stockings on both legs until for at least 2 weeks or as directed by physician office. They may be removed at night for sleeping.  MEDICATIONS:  See your medication summary on the "After Visit Summary" that nursing will review with you.  You may have some home medications which will be placed on hold until you complete the course of blood thinner medication.  It is important for you to complete the blood thinner medication as prescribed.  PRECAUTIONS:  If you experience chest pain or shortness of breath - call 911 immediately for transfer to the hospital emergency department.   If you develop a fever greater that 101 F, purulent drainage from wound, increased redness or drainage from wound, foul odor from the wound/dressing, or calf pain - CONTACT YOUR SURGEON.                                                   FOLLOW-UP APPOINTMENTS:  If you do not already have a post-op appointment, please call the office for an appointment to be seen by your surgeon.  Guidelines for how soon to be seen are listed in your "After Visit Summary", but are typically between 1-4 weeks after surgery.  OTHER INSTRUCTIONS:   Knee Replacement:  Do not place pillow under knee, focus on keeping the knee straight while resting. CPM instructions: 0-90 degrees, 2 hours in the morning, 2 hours in the afternoon, and 2 hours in the evening. Place foam block, curve side up under heel at all times except when in CPM or when walking.  DO NOT modify, tear, cut, or change the foam block in any way.  MAKE SURE YOU:  Understand these instructions.  Get help right away if you are not doing well or get worse.    Thank you for letting us be a part of your medical care team.  It is a privilege we respect greatly.  We hope these instructions will help you stay on track for a fast and full recovery!   Do not put a pillow under the knee. Place it under the heel.    Complete by:  As directed    Place gray foam block, curve side up under heel at all times except when in CPM or when walking.  DO NOT modify, tear, cut, or change in any way the gray foam block.   Increase activity slowly as tolerated    Complete by:  As directed   Patient may shower    Complete by:  As directed   Aquacel dressing is water proof    Wash over it and the whole leg with soap and water at the end of your shower   TED hose    Complete by:  As directed   Use stockings (TED hose) for 2 weeks on both leg(s).  You may remove them at night for sleeping.      Follow-up Information    Nilda SimmerWAINER,ROBERT A, MD Follow up on 05/14/2016.   Specialty:  Orthopedic Surgery Why:  appt time 9:15 am Contact information: [address] [place] [address] [phone]            Signed: Pascal LuxSHEPPERSON,Quy Lotts J 04/28/2016, 8:55 AM

## 2017-09-27 ENCOUNTER — Encounter: Payer: Self-pay | Admitting: Nurse Practitioner

## 2017-09-27 ENCOUNTER — Ambulatory Visit: Payer: Medicare Other | Admitting: Nurse Practitioner

## 2017-09-27 VITALS — BP 150/98 | HR 87 | Resp 16 | Ht 70.0 in | Wt 193.6 lb

## 2017-09-27 DIAGNOSIS — Z125 Encounter for screening for malignant neoplasm of prostate: Secondary | ICD-10-CM

## 2017-09-27 DIAGNOSIS — E782 Mixed hyperlipidemia: Secondary | ICD-10-CM

## 2017-09-27 DIAGNOSIS — Z0001 Encounter for general adult medical examination with abnormal findings: Secondary | ICD-10-CM

## 2017-09-27 DIAGNOSIS — K219 Gastro-esophageal reflux disease without esophagitis: Secondary | ICD-10-CM

## 2017-09-27 NOTE — Progress Notes (Signed)
[facility] [address] [place], [place] [address]  Internal MEDICINE  Office Visit Note  Patient Name: Danny [name]  [id number]  [id number]  Date of Service: 09/27/2017  No chief complaint on file.    The patient states that he recently had a cold. Took some medication from the pharmacy. Is now getting over it. Has no current concerns or complaints.    Pt is here for routine health maintenance examination  Current Medication: Outpatient Encounter Medications as of 09/27/2017  Medication Sig  . esomeprazole (NEXIUM) 40 MG capsule Take 40 mg by mouth daily at 12 noon.  . pravastatin (PRAVACHOL) 40 MG tablet Take 40 mg by mouth at bedtime.   Marland Kitchen. acetaminophen (TYLENOL) 325 MG tablet Take 2 tablets (650 mg total) by mouth every 6 (six) hours as needed for mild pain (or Fever >/= 101). (Patient not taking: Reported on 09/27/2017)  . aspirin EC 325 MG EC tablet 1 tab a day for the next 30 days to prevent blood clots (Patient not taking: Reported on 09/27/2017)  . docusate sodium (COLACE) 100 MG capsule 1 tab 2 times a day while on narcotics.  STOOL SOFTENER (Patient not taking: Reported on 09/27/2017)  . oxyCODONE (OXY IR/ROXICODONE) 5 MG immediate release tablet 1-2 tablets every 4-6 hrs as needed for pain (Patient not taking: Reported on 09/27/2017)  . polyethylene glycol (MIRALAX / GLYCOLAX) packet 17grams in 6 oz of water twice a day until bowel movement.  LAXITIVE.  Restart if two days since last bowel movement (Patient not taking: Reported on 09/27/2017)  . tamsulosin (FLOMAX) 0.4 MG CAPS capsule Take 1 capsule (0.4 mg total) by mouth daily after breakfast. (Patient not taking: Reported on 09/27/2017)   No facility-administered encounter medications on file as of 09/27/2017.     Surgical History: Past Surgical History:  Procedure Laterality Date  . CHOLECYSTECTOMY  2003  . 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 TOTAL KNEE ARTHROPLASTY Left 04/27/2016    Procedure: LEFT TOTAL KNEE ARTHROPLASTY;  Surgeon: Salvatore Marvelobert Wainer, MD;  [facility];  Service: Orthopedics;  Laterality: Left;    Medical History: Past Medical History:  Diagnosis Date  . Essential hypertension    a. Dr. Welton Fl[place]han in [place].  Previously on antihypertensive, which was later d/c'd.  Marland Kitchen. GERD (gastroesophageal reflux disease)   . Hyperlipidemia   . Primary localized osteoarthritis of left knee    a. L knee, pending TKA.  . PUD (peptic ulcer disease)    a. Remote h/o coffee grounds emesis followed by GI eval.  On Nexium since.    Family History: Family History  Problem Relation Age of Onset  . Parkinson's disease Mother        died @ 9694  . Pneumonia Father        died in his 2320's  . Heart disease Sister        1/2 sister - currently 4764.  Marland Kitchen. Hypertension Sister   . Stroke Sister       Review of Systems  Constitutional: Negative for chills, fatigue and unexpected weight change.  HENT: Positive for postnasal drip. Negative for congestion, rhinorrhea, sneezing and sore throat.        Improving  Eyes: Negative.  Negative for redness.  Respiratory: Negative for cough, chest tightness, shortness of breath and wheezing.   Cardiovascular: Negative for chest pain and palpitations.  Gastrointestinal: Negative for abdominal pain, constipation, diarrhea, nausea and vomiting.  Endocrine: Negative.  Negative for cold intolerance, heat intolerance, polydipsia, polyphagia and polyuria.  Genitourinary: Negative.  Negative for dysuria and frequency.  Musculoskeletal: Positive for arthralgias. Negative for back pain, joint swelling and neck pain.       Left knee arthritis. Pain comes and goes.   Skin: Negative.  Negative for rash.  Allergic/Immunologic: Negative for environmental allergies and immunocompromised state.  Neurological: Negative for tremors, numbness and headaches.  Hematological: Negative for adenopathy. Does not bruise/bleed easily.   Psychiatric/Behavioral: Negative for behavioral problems (Depression), sleep disturbance and suicidal ideas. The patient is not nervous/anxious.     Today's Vitals   09/27/17 1016  BP: (!) 150/98  Pulse: 87  Resp: 16  SpO2: 96%  Weight: 193 lb 9.6 oz (87.8 kg)  Height: 5\' 10"  (1.778 m)    Physical Exam  Constitutional: He is oriented to person, place, and time. He appears well-developed and well-nourished. No distress.  HENT:  Head: Normocephalic and atraumatic.  Mouth/Throat: Oropharynx is clear and moist. No oropharyngeal exudate.  Eyes: Conjunctivae and EOM are normal. Pupils are equal, round, and reactive to light.  Neck: Normal range of motion. Neck supple. No JVD present. Carotid bruit is not present. No tracheal deviation present. No thyromegaly present.  Cardiovascular: Normal rate and intact distal pulses. An irregular rhythm present. Exam reveals no gallop and no friction rub.  Murmur heard.  Systolic murmur is present with a grade of 2/6. Pulmonary/Chest: Effort normal and breath sounds normal. No respiratory distress. He has no wheezes. He has no rales. He exhibits no tenderness.  Abdominal: Soft. Bowel sounds are normal. There is no tenderness.  Musculoskeletal: Normal range of motion.  Lymphadenopathy:    He has no cervical adenopathy.  Neurological: He is alert and oriented to person, place, and time. No cranial nerve deficit. Coordination normal.  Skin: Skin is warm and dry. He is not diaphoretic.  Psychiatric: He has a normal mood and affect. His behavior is normal. Judgment and thought content normal.  Nursing note and vitals reviewed.  Assessment/Plan:  1. Encounter for health maintenance examination with abnormal findings Annual wellness visit today.  - Urinalysis, Routine w reflex microscopic - CBC with Differential/Platelet - Comprehensive metabolic panel - Lipid panel - TSH  2. Gastroesophageal reflux disease without esophagitis Continue nexium as  prescribed.   3. Mixed hyperlipidemia Fasting lipid panel ordered. Adjust statin as indicated.   4. Screening for prostate cancer - PSA   He should return to the office in 6 months and sooner if needed.   General Counseling: Tekoa verbalizes understanding of the findings of todays visit and agrees with plan of treatment. I have discussed any further diagnostic evaluation that may be needed or ordered today. We also reviewed his medications today. he has been encouraged to call the office with any questions or concerns that should arise related to todays visit.  This patient was seen by Vincent Gros, FNP- C in Collaboration with Dr Lyndon Code as a part of collaborative care agreement    Orders Placed This Encounter  Procedures  . Urinalysis, Routine w reflex microscopic      Time spent: 76 Minutes      Lyndon Code, MD  Internal Medicine

## 2017-09-28 LAB — URINALYSIS, ROUTINE W REFLEX MICROSCOPIC
Bilirubin, UA: NEGATIVE
GLUCOSE, UA: NEGATIVE
Ketones, UA: NEGATIVE
Leukocytes, UA: NEGATIVE
Nitrite, UA: NEGATIVE
PROTEIN UA: NEGATIVE
RBC, UA: NEGATIVE
Specific Gravity, UA: 1.009 (ref 1.005–1.030)
Urobilinogen, Ur: 0.2 mg/dL (ref 0.2–1.0)
pH, UA: 6 (ref 5.0–7.5)

## 2017-09-29 LAB — COMPREHENSIVE METABOLIC PANEL
ALBUMIN: 4.4 g/dL (ref 3.5–4.7)
ALK PHOS: 102 IU/L (ref 39–117)
ALT: 11 IU/L (ref 0–44)
AST: 17 IU/L (ref 0–40)
Albumin/Globulin Ratio: 1.7 (ref 1.2–2.2)
BUN / CREAT RATIO: 16 (ref 10–24)
BUN: 17 mg/dL (ref 8–27)
Bilirubin Total: 0.7 mg/dL (ref 0.0–1.2)
CO2: 24 mmol/L (ref 20–29)
CREATININE: 1.04 mg/dL (ref 0.76–1.27)
Calcium: 9.3 mg/dL (ref 8.6–10.2)
Chloride: 99 mmol/L (ref 96–106)
GFR calc Af Amer: 75 mL/min/{1.73_m2} (ref >59)
GFR calc non Af Amer: 65 mL/min/{1.73_m2} (ref >59)
GLOBULIN, TOTAL: 2.6 g/dL (ref 1.5–4.5)
Glucose: 105 mg/dL — ABNORMAL HIGH (ref 65–99)
Potassium: 4.3 mmol/L (ref 3.5–5.2)
SODIUM: 141 mmol/L (ref 134–144)
Total Protein: 7 g/dL (ref 6.0–8.5)

## 2017-09-29 LAB — CBC WITH DIFFERENTIAL/PLATELET
BASOS: 0 %
Basophils Absolute: 0 10*3/uL (ref 0.0–0.2)
EOS (ABSOLUTE): 0.2 10*3/uL (ref 0.0–0.4)
EOS: 3 %
HEMATOCRIT: 48.4 % (ref 37.5–51.0)
HEMOGLOBIN: 16.7 g/dL (ref 13.0–17.7)
Immature Grans (Abs): 0 10*3/uL (ref 0.0–0.1)
Immature Granulocytes: 0 %
LYMPHS ABS: 1.6 10*3/uL (ref 0.7–3.1)
Lymphs: 26 %
MCH: 30.1 pg (ref 26.6–33.0)
MCHC: 34.5 g/dL (ref 31.5–35.7)
MCV: 87 fL (ref 79–97)
MONOCYTES: 7 %
Monocytes Absolute: 0.4 10*3/uL (ref 0.1–0.9)
NEUTROS ABS: 4 10*3/uL (ref 1.4–7.0)
Neutrophils: 64 %
Platelets: 221 10*3/uL (ref 150–379)
RBC: 5.55 x10E6/uL (ref 4.14–5.80)
RDW: 13.8 % (ref 12.3–15.4)
WBC: 6.3 10*3/uL (ref 3.4–10.8)

## 2017-09-29 LAB — PSA: Prostate Specific Ag, Serum: 3.9 ng/mL (ref 0.0–4.0)

## 2017-09-29 LAB — TSH: TSH: 3.02 u[IU]/mL (ref 0.450–4.500)

## 2017-09-29 LAB — LIPID PANEL
CHOL/HDL RATIO: 3.6 ratio (ref 0.0–5.0)
CHOLESTEROL TOTAL: 152 mg/dL (ref 100–199)
HDL: 42 mg/dL (ref >39)
LDL CALC: 86 mg/dL (ref 0–99)
Triglycerides: 122 mg/dL (ref 0–149)
VLDL Cholesterol Cal: 24 mg/dL (ref 5–40)

## 2018-01-11 ENCOUNTER — Other Ambulatory Visit: Payer: Self-pay | Admitting: Orthopedic Surgery

## 2018-01-11 ENCOUNTER — Ambulatory Visit
Admission: RE | Admit: 2018-01-11 | Discharge: 2018-01-11 | Disposition: A | Discharge status: Home / Self Care | Payer: Medicare Other | Source: Ambulatory Visit | Attending: Orthopedic Surgery | Admitting: Orthopedic Surgery

## 2018-01-11 DIAGNOSIS — M25561 Pain in right knee: Secondary | ICD-10-CM | POA: Diagnosis present

## 2018-01-11 DIAGNOSIS — M25461 Effusion, right knee: Secondary | ICD-10-CM | POA: Diagnosis not present

## 2018-01-11 DIAGNOSIS — R52 Pain, unspecified: Secondary | ICD-10-CM

## 2018-01-11 DIAGNOSIS — M1711 Unilateral primary osteoarthritis, right knee: Secondary | ICD-10-CM | POA: Diagnosis not present

## 2018-01-11 DIAGNOSIS — Z96652 Presence of left artificial knee joint: Secondary | ICD-10-CM | POA: Insufficient documentation

## 2018-02-08 ENCOUNTER — Ambulatory Visit: Payer: Medicare Other | Admitting: Nurse Practitioner

## 2018-02-08 ENCOUNTER — Encounter: Payer: Self-pay | Admitting: Nurse Practitioner

## 2018-02-08 VITALS — BP 132/98 | HR 71 | Resp 16 | Ht 70.0 in | Wt 191.4 lb

## 2018-02-08 DIAGNOSIS — Z125 Encounter for screening for malignant neoplasm of prostate: Secondary | ICD-10-CM | POA: Diagnosis not present

## 2018-02-08 DIAGNOSIS — M1711 Unilateral primary osteoarthritis, right knee: Secondary | ICD-10-CM

## 2018-02-08 DIAGNOSIS — Z01818 Encounter for other preprocedural examination: Secondary | ICD-10-CM | POA: Diagnosis not present

## 2018-02-08 DIAGNOSIS — E782 Mixed hyperlipidemia: Secondary | ICD-10-CM | POA: Diagnosis not present

## 2018-02-08 NOTE — Progress Notes (Signed)
[facility] [address] [place], [place] [address]  Internal MEDICINE  Office Visit Note  Patient Name: Danny [name]  [id number]  [id number]  Date of Service: 02/08/2018  Chief Complaint  Patient presents with  . Knee Pain    right -  patient scheduled to have right knee replacement surgery 02/26/2018    The patient is scheduled to have total right knee replacement 02/26/2018. He is here to get surgical clearance for this surgery. Has had left knee replaced already. Did very well with this surgery. Quick recovery with no complications.    Pt is here for routine follow up.    Current Medication: Outpatient Encounter Medications as of 02/08/2018  Medication Sig  . esomeprazole (NEXIUM) 40 MG capsule Take 40 mg by mouth daily at 12 noon.  . pravastatin (PRAVACHOL) 40 MG tablet Take 40 mg by mouth at bedtime.   Marland Kitchen acetaminophen (TYLENOL) 325 MG tablet Take 2 tablets (650 mg total) by mouth every 6 (six) hours as needed for mild pain (or Fever >/= 101). (Patient not taking: Reported on 09/27/2017)  . aspirin EC 325 MG EC tablet 1 tab a day for the next 30 days to prevent blood clots (Patient not taking: Reported on 09/27/2017)  . docusate sodium (COLACE) 100 MG capsule 1 tab 2 times a day while on narcotics.  STOOL SOFTENER (Patient not taking: Reported on 09/27/2017)  . oxyCODONE (OXY IR/ROXICODONE) 5 MG immediate release tablet 1-2 tablets every 4-6 hrs as needed for pain (Patient not taking: Reported on 09/27/2017)  . polyethylene glycol (MIRALAX / GLYCOLAX) packet 17grams in 6 oz of water twice a day until bowel movement.  LAXITIVE.  Restart if two days since last bowel movement (Patient not taking: Reported on 09/27/2017)  . tamsulosin (FLOMAX) 0.4 MG CAPS capsule Take 1 capsule (0.4 mg total) by mouth daily after breakfast. (Patient not taking: Reported on 09/27/2017)   No facility-administered encounter medications on file as of 02/08/2018.     Surgical History: Past  Surgical History:  Procedure Laterality Date  . CHOLECYSTECTOMY  2003  . 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 TOTAL KNEE ARTHROPLASTY Left 04/27/2016   Procedure: LEFT TOTAL KNEE ARTHROPLASTY;  Surgeon: Salvatore Marvel, MD;  Location: [facility] OR;  Service: Orthopedics;  Laterality: Left;    Medical History: Past Medical History:  Diagnosis Date  . Essential hypertension    a. Dr. Welton Flakes in [place] managing.  Previously on antihypertensive, which was later d/c'd.  Marland Kitchen GERD (gastroesophageal reflux disease)   . Hyperlipidemia   . Primary localized osteoarthritis of left knee    a. L knee, pending TKA.  . PUD (peptic ulcer disease)    a. Remote h/o coffee grounds emesis followed by GI eval.  On Nexium since.    Family History: Family History  Problem Relation Age of Onset  . Parkinson's disease Mother        died @ 38  . Pneumonia Father        died in his 40's  . Heart disease Sister        1/2 sister - currently 20.  Marland Kitchen Hypertension Sister   . Stroke Sister     Social History   Socioeconomic History  . Marital status: Married    Spouse name: Not on file  . Number of children: Not on file  . Years of education: Not on file  . Highest education level: Not on file  Occupational History    Comment: Previously worked in [employer] in [place]  Social Needs  . Financial resource strain: Not on file  . Food insecurity:    Worry: Not on file    Inability: Not on file  . Transportation needs:    Medical: Not on file    Non-medical: Not on file  Tobacco Use  . Smoking status: Never Smoker  . Smokeless tobacco: Former Engineer, waterUser  Substance and Sexual Activity  . Alcohol use: No    Frequency: Never  . Drug use: No  . Sexual activity: Not on file  Lifestyle  . Physical activity:    Days per week: Not on file    Minutes per session: Not on file  . Stress: Not on file  Relationships  . Social connections:    Talks on phone: Not on file    Gets together: Not on  file    Attends religious service: Not on file    Active member of club or organization: Not on file    Attends meetings of clubs or organizations: Not on file    Relationship status: Not on file  . Intimate partner violence:    Fear of current or ex partner: Not on file    Emotionally abused: Not on file    Physically abused: Not on file    Forced sexual activity: Not on file  Other Topics Concern  . Not on file  Social History Narrative   Married takes care of wife who has Parkinson's and Early Alzheimers.  Fairly active.      Review of Systems  Constitutional: Negative for activity change, chills, fatigue and unexpected weight change.  HENT: Negative for congestion, postnasal drip, rhinorrhea, sneezing and sore throat.        Improving  Eyes: Negative.  Negative for redness.  Respiratory: Negative for cough, chest tightness, shortness of breath and wheezing.   Cardiovascular: Negative for chest pain and palpitations.  Gastrointestinal: Negative for abdominal pain, constipation, diarrhea, nausea and vomiting.  Endocrine: Negative for cold intolerance, heat intolerance, polydipsia, polyphagia and polyuria.  Genitourinary: Negative.  Negative for dysuria and frequency.  Musculoskeletal: Positive for arthralgias. Negative for back pain, joint swelling and neck pain.       Right knee arthritis. Pain with applying weight on right leg. Scheduled to have knee replacement 02/26/2018.  Skin: Negative for rash.  Allergic/Immunologic: Negative for environmental allergies and immunocompromised state.  Neurological: Negative for dizziness, tremors and numbness.  Hematological: Negative for adenopathy. Does not bruise/bleed easily.  Psychiatric/Behavioral: Negative for behavioral problems (Depression), sleep disturbance and suicidal ideas. The patient is not nervous/anxious.     Vital Signs: BP (!) 132/98 (BP Location: Right Arm, Patient Position: Sitting)   Pulse 71   Resp 16   Ht 5\' 10"   (1.778 m)   Wt 191 lb 6.4 oz (86.8 kg)   SpO2 97%   BMI 27.46 kg/m    Physical Exam  Constitutional: He is oriented to person, place, and time. He appears well-developed and well-nourished. No distress.  HENT:  Head: Normocephalic and atraumatic.  Nose: Nose normal.  Mouth/Throat: Oropharynx is clear and moist. No oropharyngeal exudate.  Eyes: Pupils are equal, round, and reactive to light. Conjunctivae and EOM are normal.  Neck: Normal range of motion. Neck supple. No JVD present. Carotid bruit is not present. No tracheal deviation present. No thyromegaly present.  Cardiovascular: Normal rate and intact distal pulses. An irregular rhythm present. Exam reveals no gallop and  no friction rub.  Murmur heard.  Systolic murmur is present with a grade of 2/6. ECG today is within normal limits  Pulmonary/Chest: Effort normal and breath sounds normal. No respiratory distress. He has no wheezes. He has no rales. He exhibits no tenderness.  Abdominal: Soft. Bowel sounds are normal. There is no tenderness.  Musculoskeletal: Normal range of motion.  Moderate right knee tenderness. More severe with flexion and extension of the knee. Crepitus present.   Lymphadenopathy:    He has no cervical adenopathy.  Neurological: He is alert and oriented to person, place, and time. No cranial nerve deficit. Coordination normal.  Skin: Skin is warm and dry. He is not diaphoretic.  Psychiatric: He has a normal mood and affect. His behavior is normal. Judgment and thought content normal.  Nursing note and vitals reviewed.  Assessment/Plan: 1. Primary osteoarthritis of right knee Patient scheduled to have knee replacement on 02/26/2018  2. Pre-op exam EKG normal today. Check CBC and CMP. Patient is cleared to have surgery with low risk.  - EKG 12-Lead - CBC with Differential/Platelet - Comprehensive metabolic panel  3. Mixed hyperlipidemia Check fasting lipid panel. - CBC with Differential/Platelet -  Comprehensive metabolic panel - Lipid panel - TSH  4. Screening for prostate cancer - PSA  General Counseling: Aarik verbalizes understanding of the findings of todays visit and agrees with plan of treatment. I have discussed any further diagnostic evaluation that may be needed or ordered today. We also reviewed his medications today. he has been encouraged to call the office with any questions or concerns that should arise related to todays visit.    Counseling:  The patient is cleared to have surgery 02/26/2018 with low risk.   This patient was seen by Vincent Gros, FNP- C in Collaboration with Dr Lyndon Code as a part of collaborative care agreement   Orders Placed This Encounter  Procedures  . PSA  . CBC with Differential/Platelet  . Comprehensive metabolic panel  . Lipid panel  . TSH  . EKG 12-Lead    Time spent: 80 Minutes    Dr Lyndon Code Internal medicine

## 2018-02-11 LAB — CBC WITH DIFFERENTIAL/PLATELET
BASOS ABS: 0 10*3/uL (ref 0.0–0.2)
Basos: 0 %
EOS (ABSOLUTE): 0.2 10*3/uL (ref 0.0–0.4)
Eos: 3 %
HEMOGLOBIN: 15.7 g/dL (ref 13.0–17.7)
Hematocrit: 44.7 % (ref 37.5–51.0)
IMMATURE GRANS (ABS): 0 10*3/uL (ref 0.0–0.1)
IMMATURE GRANULOCYTES: 0 %
LYMPHS: 29 %
Lymphocytes Absolute: 2.1 10*3/uL (ref 0.7–3.1)
MCH: 29.8 pg (ref 26.6–33.0)
MCHC: 35.1 g/dL (ref 31.5–35.7)
MCV: 85 fL (ref 79–97)
MONOCYTES: 6 %
Monocytes Absolute: 0.5 10*3/uL (ref 0.1–0.9)
Neutrophils Absolute: 4.5 10*3/uL (ref 1.4–7.0)
Neutrophils: 62 %
Platelets: 191 10*3/uL (ref 150–450)
RBC: 5.26 x10E6/uL (ref 4.14–5.80)
RDW: 14 % (ref 12.3–15.4)
WBC: 7.3 10*3/uL (ref 3.4–10.8)

## 2018-02-11 LAB — COMPREHENSIVE METABOLIC PANEL
ALBUMIN: 4.5 g/dL (ref 3.5–4.7)
ALK PHOS: 96 IU/L (ref 39–117)
ALT: 9 IU/L (ref 0–44)
AST: 13 IU/L (ref 0–40)
Albumin/Globulin Ratio: 1.8 (ref 1.2–2.2)
BUN / CREAT RATIO: 17 (ref 10–24)
BUN: 21 mg/dL (ref 8–27)
Bilirubin Total: 1.2 mg/dL (ref 0.0–1.2)
CO2: 24 mmol/L (ref 20–29)
CREATININE: 1.23 mg/dL (ref 0.76–1.27)
Calcium: 9.4 mg/dL (ref 8.6–10.2)
Chloride: 103 mmol/L (ref 96–106)
GFR calc Af Amer: 61 mL/min/{1.73_m2} (ref >59)
GFR calc non Af Amer: 53 mL/min/{1.73_m2} — ABNORMAL LOW (ref >59)
GLUCOSE: 94 mg/dL (ref 65–99)
Globulin, Total: 2.5 g/dL (ref 1.5–4.5)
Potassium: 4.6 mmol/L (ref 3.5–5.2)
Sodium: 140 mmol/L (ref 134–144)
TOTAL PROTEIN: 7 g/dL (ref 6.0–8.5)

## 2018-02-11 LAB — LIPID PANEL
CHOLESTEROL TOTAL: 150 mg/dL (ref 100–199)
Chol/HDL Ratio: 3.3 ratio (ref 0.0–5.0)
HDL: 45 mg/dL (ref >39)
LDL CALC: 85 mg/dL (ref 0–99)
Triglycerides: 99 mg/dL (ref 0–149)
VLDL CHOLESTEROL CAL: 20 mg/dL (ref 5–40)

## 2018-02-11 LAB — PSA: Prostate Specific Ag, Serum: 3 ng/mL (ref 0.0–4.0)

## 2018-02-11 LAB — TSH: TSH: 2.83 u[IU]/mL (ref 0.450–4.500)

## 2018-03-03 ENCOUNTER — Other Ambulatory Visit (HOSPITAL_COMMUNITY): Payer: Medicare Other

## 2018-03-23 ENCOUNTER — Encounter: Payer: Self-pay | Admitting: Physician Assistant

## 2018-03-23 DIAGNOSIS — Z96652 Presence of left artificial knee joint: Secondary | ICD-10-CM

## 2018-03-23 DIAGNOSIS — M1711 Unilateral primary osteoarthritis, right knee: Secondary | ICD-10-CM | POA: Diagnosis present

## 2018-03-23 NOTE — H&P (Signed)
TOTAL KNEE ADMISSION H&P  Patient is being admitted for right total knee arthroplasty.  Subjective:  Chief Complaint:right knee pain.  HPI: Danny [name], 84 y.o. male, has a history of pain and functional disability in the right knee due to arthritis and has failed non-surgical conservative treatments for greater than 12 weeks to includeNSAID's and/or analgesics, corticosteriod injections, viscosupplementation injections, flexibility and strengthening excercises, supervised PT with diminished ADL's post treatment, weight reduction as appropriate and activity modification.  Onset of symptoms was gradual, starting 10 years ago with gradually worsening course since that time. The patient noted no past surgery on the right knee(s).  Patient currently rates pain in the right knee(s) at 10 out of 10 with activity. Patient has night pain, worsening of pain with activity and weight bearing, pain that interferes with activities of daily living, crepitus and joint swelling.  Patient has evidence of subchondral sclerosis, periarticular osteophytes and joint space narrowing by imaging studies. There is no active infection.  Patient Active Problem List   Diagnosis Date Noted  . S/P total knee replacement, left   . Primary localized osteoarthritis of right knee   . Pre-op exam 02/08/2018  . Screening for prostate cancer 02/08/2018  . Essential hypertension   . GERD (gastroesophageal reflux disease)   . Hyperlipidemia   . Primary osteoarthritis of right knee    Past Medical History:  Diagnosis Date  . Essential hypertension    a. Dr. Welton Flakes in [place] managing.  Previously on antihypertensive, which was later d/c'd.  Marland Kitchen GERD (gastroesophageal reflux disease)   . Hyperlipidemia   . Primary localized osteoarthritis of left knee    patient had left total knee 04/27/16  . Primary localized osteoarthritis of right knee   . PUD (peptic ulcer disease)    a. Remote h/o coffee grounds emesis followed by GI  eval.  On Nexium since.  . S/P total knee replacement, left     Past Surgical History:  Procedure Laterality Date  . CHOLECYSTECTOMY  2003  . 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 TOTAL KNEE ARTHROPLASTY Left 04/27/2016   Procedure: LEFT TOTAL KNEE ARTHROPLASTY;  Surgeon: Salvatore Marvel, MD;  Location: [facility] OR;  Service: Orthopedics;  Laterality: Left;    No current facility-administered medications for this encounter.    Current Outpatient Medications  Medication Sig Dispense Refill Last Dose  . esomeprazole (NEXIUM) 40 MG capsule Take 40 mg by mouth daily before breakfast.   03/23/2018 at Unknown time  . pravastatin (PRAVACHOL) 40 MG tablet Take 40 mg by mouth daily at 12 noon.    03/23/2018 at Unknown time   Allergies  Allergen Reactions  . No Known Allergies     Social History   Tobacco Use  . Smoking status: Never Smoker  . Smokeless tobacco: Former Engineer, water Use Topics  . Alcohol use: No    Frequency: Never    Family History  Problem Relation Age of Onset  . Parkinson's disease Mother        died @ 39  . Pneumonia Father        died in his 22's  . Heart disease Sister        1/2 sister - currently 103.  Marland Kitchen Hypertension Sister   . Stroke Sister      Review of Systems  Constitutional: Negative.   HENT: Negative.   Eyes: Negative.   Respiratory: Negative.   Cardiovascular: Negative.   Gastrointestinal:  Negative.   Genitourinary: Negative.   Musculoskeletal: Positive for back pain and joint pain.  Skin: Negative.   Neurological: Negative.   Endo/Heme/Allergies: Negative.   Psychiatric/Behavioral: Negative.     Objective:  Physical Exam  Constitutional: He is oriented to person, place, and time. He appears well-developed and well-nourished.  HENT:  Head: Normocephalic and atraumatic.  Mouth/Throat: Oropharynx is clear and moist.  Eyes: Pupils are equal, round, and reactive to light. Conjunctivae are normal.  Neck: Neck supple.   Cardiovascular: Normal rate and regular rhythm.  Respiratory: Effort normal and breath sounds normal.  GI: Soft. Bowel sounds are normal.  Genitourinary:  Genitourinary Comments: Not pertinent to current symptomatology therefore not examined.  Musculoskeletal:  Examination of his right knee reveals pain medially and laterally.  Moderate varus deformity.  1+ crepitation.  1+ synovitis.  Range of motion is from -5 to 125 degrees.  Knee is stable with normal patella tracking.  Examination of his left knee reveals well healed total knee incision without swelling or pain.  Range of motion 0-120 degrees.  Knee is stable with normal patella tracking.    Neurological: He is alert and oriented to person, place, and time.  Skin: Skin is warm and dry.  Psychiatric: He has a normal mood and affect. His behavior is normal.    Vital signs in last 24 hours: Temp:  [98.1 F (36.7 C)] 98.1 F (36.7 C) (07/17 1400) Pulse Rate:  [96] 96 (07/17 1400) BP: (159)/(96) 159/96 (07/17 1400) SpO2:  [97 %] 97 % (07/17 1400) Weight:  [86.2 kg (190 lb)] 86.2 kg (190 lb) (07/17 1400)  Labs:   Estimated body mass index is 28.06 kg/m as calculated from the following:   Height as of this encounter: 5\' 9"  (1.753 m).   Weight as of this encounter: 86.2 kg (190 lb).   Imaging Review Plain radiographs demonstrate severe degenerative joint disease of the right knee(s). The overall alignment issignificant varus. The bone quality appears to be good for age and reported activity level.   Preoperative templating of the joint replacement has been completed, documented, and submitted to the Operating Room personnel in order to optimize intra-operative equipment management.   Anticipated LOS equal to or greater than 2 midnights due to - Age 82 and older with one or more of the following:  - Obesity  - Expected need for hospital services (PT, OT, Nursing) required for safe  discharge  - Anticipated need for  postoperative skilled nursing care or inpatient rehab  - Active co-morbidities: HTN, history of PUD OR   - Unanticipated findings during/Post Surgery: None  - Patient is a high risk of re-admission due to: None     Assessment/Plan:  End stage arthritis, right knee  Principal Problem:   Primary osteoarthritis of right knee Active Problems:   GERD (gastroesophageal reflux disease)   Hyperlipidemia   Essential hypertension   S/P total knee replacement, left   Primary localized osteoarthritis of right knee  The patient history, physical examination, clinical judgment of the provider and imaging studies are consistent with end stage degenerative joint disease of the right knee(s) and total knee arthroplasty is deemed medically necessary. The treatment options including medical management, injection therapy arthroscopy and arthroplasty were discussed at length. The risks and benefits of total knee arthroplasty were presented and reviewed. The risks due to aseptic loosening, infection, stiffness, patella tracking problems, thromboembolic complications and other imponderables were discussed. The patient acknowledged the explanation, agreed to proceed with the  plan and consent was signed. Patient is being admitted for inpatient treatment for surgery, pain control, PT, OT, prophylactic antibiotics, VTE prophylaxis, progressive ambulation and ADL's and discharge planning. The patient is planning to be discharged home with home health services

## 2018-03-24 ENCOUNTER — Encounter (HOSPITAL_COMMUNITY): Payer: Self-pay

## 2018-03-24 ENCOUNTER — Encounter (HOSPITAL_COMMUNITY)
Admission: RE | Admit: 2018-03-24 | Discharge: 2018-03-24 | Disposition: A | Discharge status: Home / Self Care | Payer: Medicare Other | Source: Ambulatory Visit | Attending: Orthopedic Surgery | Admitting: Orthopedic Surgery

## 2018-03-24 ENCOUNTER — Other Ambulatory Visit: Payer: Self-pay

## 2018-03-24 DIAGNOSIS — Z01812 Encounter for preprocedural laboratory examination: Secondary | ICD-10-CM | POA: Insufficient documentation

## 2018-03-24 LAB — TYPE AND SCREEN
ABO/RH(D): O NEG
ANTIBODY SCREEN: NEGATIVE

## 2018-03-24 LAB — CBC WITH DIFFERENTIAL/PLATELET
Abs Immature Granulocytes: 0 10*3/uL (ref 0.0–0.1)
Basophils Absolute: 0 10*3/uL (ref 0.0–0.1)
Basophils Relative: 0 %
Eosinophils Absolute: 0.1 10*3/uL (ref 0.0–0.7)
Eosinophils Relative: 2 %
HEMATOCRIT: 50.2 % (ref 39.0–52.0)
Hemoglobin: 16.2 g/dL (ref 13.0–17.0)
IMMATURE GRANULOCYTES: 0 %
LYMPHS PCT: 24 %
Lymphs Abs: 1.7 10*3/uL (ref 0.7–4.0)
MCH: 29 pg (ref 26.0–34.0)
MCHC: 32.3 g/dL (ref 30.0–36.0)
MCV: 89.8 fL (ref 78.0–100.0)
Monocytes Absolute: 0.6 10*3/uL (ref 0.1–1.0)
Monocytes Relative: 8 %
NEUTROS ABS: 4.7 10*3/uL (ref 1.7–7.7)
NEUTROS PCT: 66 %
Platelets: 197 10*3/uL (ref 150–400)
RBC: 5.59 MIL/uL (ref 4.22–5.81)
RDW: 13.3 % (ref 11.5–15.5)
WBC: 7.1 10*3/uL (ref 4.0–10.5)

## 2018-03-24 LAB — SURGICAL PCR SCREEN
MRSA, PCR: NEGATIVE
STAPHYLOCOCCUS AUREUS: NEGATIVE

## 2018-03-24 LAB — COMPREHENSIVE METABOLIC PANEL
ALBUMIN: 4 g/dL (ref 3.5–5.0)
ALT: 14 U/L (ref 0–44)
ANION GAP: 8 (ref 5–15)
AST: 18 U/L (ref 15–41)
Alkaline Phosphatase: 97 U/L (ref 38–126)
BILIRUBIN TOTAL: 1.3 mg/dL — AB (ref 0.3–1.2)
BUN: 16 mg/dL (ref 8–23)
CO2: 26 mmol/L (ref 22–32)
Calcium: 8.9 mg/dL (ref 8.9–10.3)
Chloride: 105 mmol/L (ref 98–111)
Creatinine, Ser: 1.26 mg/dL — ABNORMAL HIGH (ref 0.61–1.24)
GFR calc Af Amer: 58 mL/min — ABNORMAL LOW (ref >60)
GFR calc non Af Amer: 50 mL/min — ABNORMAL LOW (ref >60)
GLUCOSE: 112 mg/dL — AB (ref 70–99)
POTASSIUM: 4 mmol/L (ref 3.5–5.1)
SODIUM: 139 mmol/L (ref 135–145)
TOTAL PROTEIN: 7 g/dL (ref 6.5–8.1)

## 2018-03-24 LAB — APTT: aPTT: 34 seconds (ref 24–36)

## 2018-03-24 LAB — PROTIME-INR
INR: 1.04
Prothrombin Time: 13.5 seconds (ref 11.4–15.2)

## 2018-03-24 NOTE — Pre-Procedure Instructions (Signed)
Danny [name] Every  03/24/2018      [id number] [place]- [place], [place] - [address] [place] [place] [id number] Phone: [phone] Fax: [phone]    Your procedure is scheduled on July 29  [facility] Admitting at 0530 A.[name].  Call this number if you have problems the morning of surgery:  [phone]   Remember:  Do not eat or drink after midnight.     Take these medicines the morning of surgery with A SIP OF WATER  esomeprazole (NEXIUM)   7 days prior to surgery STOP taking any Aspirin(unless otherwise instructed by your surgeon), Aleve, Naproxen, Ibuprofen, Motrin, Advil, Goody's, BC's, all herbal medications, fish oil, and all vitamins     Do not wear jewelry  Do not wear lotions, powders, or cologne, or deodorant.  Men may shave face and neck.  Do not bring valuables to the hospital.  [facility] is not responsible for any belongings or valuables.  Contacts, dentures or bridgework may not be worn into surgery.  Leave your suitcase in the car.  After surgery it may be brought to your room.  For patients admitted to the hospital, discharge time will be determined by your treatment team.  Patients discharged the day of surgery will not be allowed to drive home.   Special instructions:   [place]- Preparing For Surgery  Before surgery, you can play an important role. Because skin is not sterile, your skin needs to be as free of germs as possible. You can reduce the number of germs on your skin by washing with CHG (chlorahexidine gluconate) Soap before surgery.  CHG is an antiseptic cleaner which kills germs and bonds with the skin to continue killing germs even after washing.    Oral Hygiene is also important to reduce your risk of infection.  Remember - BRUSH YOUR TEETH THE MORNING OF SURGERY WITH YOUR REGULAR TOOTHPASTE  Please do not use if you have an allergy to CHG or antibacterial soaps. If your skin becomes  reddened/irritated stop using the CHG.  Do not shave (including legs and underarms) for at least 48 hours prior to first CHG shower. It is OK to shave your face.  Please follow these instructions carefully.   1. Shower the NIGHT BEFORE SURGERY and the MORNING OF SURGERY with CHG.   2. If you chose to wash your hair, wash your hair first as usual with your normal shampoo.  3. After you shampoo, rinse your hair and body thoroughly to remove the shampoo.  4. Use CHG as you would any other liquid soap. You can apply CHG directly to the skin and wash gently with a scrungie or a clean washcloth.   5. Apply the CHG Soap to your body ONLY FROM THE NECK DOWN.  Do not use on open wounds or open sores. Avoid contact with your eyes, ears, mouth and genitals (private parts). Wash Face and genitals (private parts)  with your normal soap.  6. Wash thoroughly, paying special attention to the area where your surgery will be performed.  7. Thoroughly rinse your body with warm water from the neck down.  8. DO NOT shower/wash with your normal soap after using and rinsing off the CHG Soap.  9. Pat yourself dry with a CLEAN TOWEL.  10. Wear CLEAN PAJAMAS to bed the night before surgery, wear comfortable clothes the morning of surgery  11. Place CLEAN SHEETS on your bed the night  of your first shower and DO NOT SLEEP WITH PETS.    Day of Surgery:  Do not apply any deodorants/lotions.  Please wear clean clothes to the hospital/surgery center.   Remember to brush your teeth WITH YOUR REGULAR TOOTHPASTE.    Please read over the following fact sheets that you were given.

## 2018-03-24 NOTE — Progress Notes (Signed)
PCP - Welton FlakesKhan Cardiologist -denies   Chest x-ray - not needed EKG - 02/08/18 Stress Test -denies  ECHO - denies Cardiac Cath - denies   Anesthesia review: NO  Patient denies shortness of breath, fever, cough and chest pain at PAT appointment   Patient verbalized understanding of instructions that were given to them at the PAT appointment. Patient was also instructed that they will need to review over the PAT instructions again at home before surgery.

## 2018-03-25 LAB — URINE CULTURE: CULTURE: NO GROWTH

## 2018-03-28 ENCOUNTER — Ambulatory Visit: Payer: Self-pay | Admitting: Nurse Practitioner

## 2018-03-31 ENCOUNTER — Telehealth: Payer: Self-pay

## 2018-03-31 NOTE — Telephone Encounter (Signed)
Faxed preoperative clearance to [facility]  °

## 2018-04-02 MED ORDER — SODIUM CHLORIDE 0.9 % IV SOLN
1000.0000 mg | INTRAVENOUS | Status: AC
  Administered 2018-04-04: 1000 mg via INTRAVENOUS
  Filled 2018-04-02: qty 1100

## 2018-04-04 ENCOUNTER — Inpatient Hospital Stay (HOSPITAL_COMMUNITY)
Admission: AD | Admit: 2018-04-04 | Discharge: 2018-04-05 | DRG: 470 | Disposition: A | Discharge status: Home Health | Payer: Medicare Other | Source: Ambulatory Visit | Attending: Orthopedic Surgery | Admitting: Orthopedic Surgery

## 2018-04-04 ENCOUNTER — Encounter (HOSPITAL_COMMUNITY)
Admission: AD | Disposition: A | Discharge status: Home Health | Payer: Self-pay | Source: Ambulatory Visit | Attending: Orthopedic Surgery

## 2018-04-04 ENCOUNTER — Other Ambulatory Visit: Payer: Self-pay

## 2018-04-04 ENCOUNTER — Encounter (HOSPITAL_COMMUNITY): Payer: Self-pay

## 2018-04-04 ENCOUNTER — Inpatient Hospital Stay (HOSPITAL_COMMUNITY): Payer: Medicare Other | Admitting: Anesthesiology

## 2018-04-04 DIAGNOSIS — Z9049 Acquired absence of other specified parts of digestive tract: Secondary | ICD-10-CM

## 2018-04-04 DIAGNOSIS — Z823 Family history of stroke: Secondary | ICD-10-CM

## 2018-04-04 DIAGNOSIS — E782 Mixed hyperlipidemia: Secondary | ICD-10-CM | POA: Diagnosis present

## 2018-04-04 DIAGNOSIS — Z820 Family history of epilepsy and other diseases of the nervous system: Secondary | ICD-10-CM

## 2018-04-04 DIAGNOSIS — M1711 Unilateral primary osteoarthritis, right knee: Principal | ICD-10-CM | POA: Diagnosis present

## 2018-04-04 DIAGNOSIS — Z8249 Family history of ischemic heart disease and other diseases of the circulatory system: Secondary | ICD-10-CM

## 2018-04-04 DIAGNOSIS — K219 Gastro-esophageal reflux disease without esophagitis: Secondary | ICD-10-CM | POA: Diagnosis present

## 2018-04-04 DIAGNOSIS — E785 Hyperlipidemia, unspecified: Secondary | ICD-10-CM | POA: Diagnosis present

## 2018-04-04 DIAGNOSIS — Z96651 Presence of right artificial knee joint: Secondary | ICD-10-CM

## 2018-04-04 DIAGNOSIS — I10 Essential (primary) hypertension: Secondary | ICD-10-CM | POA: Diagnosis present

## 2018-04-04 DIAGNOSIS — Z96652 Presence of left artificial knee joint: Secondary | ICD-10-CM

## 2018-04-04 HISTORY — DX: Unilateral primary osteoarthritis, right knee: M17.11

## 2018-04-04 HISTORY — PX: TOTAL KNEE ARTHROPLASTY: SHX125

## 2018-04-04 HISTORY — DX: Presence of left artificial knee joint: Z96.652

## 2018-04-04 SURGERY — ARTHROPLASTY, KNEE, TOTAL
Anesthesia: Spinal | Laterality: Right

## 2018-04-04 MED ORDER — POTASSIUM CHLORIDE IN NACL 20-0.9 MEQ/L-% IV SOLN
INTRAVENOUS | Status: DC
  Administered 2018-04-04 (×2): via INTRAVENOUS
  Filled 2018-04-04 (×2): qty 1000

## 2018-04-04 MED ORDER — PROPOFOL 500 MG/50ML IV EMUL
INTRAVENOUS | Status: DC | PRN
  Administered 2018-04-04: 75 ug/kg/min via INTRAVENOUS

## 2018-04-04 MED ORDER — PRAVASTATIN SODIUM 40 MG PO TABS
40.0000 mg | ORAL_TABLET | Freq: Every day | ORAL | Status: DC
  Administered 2018-04-05: 40 mg via ORAL
  Filled 2018-04-04 (×2): qty 1

## 2018-04-04 MED ORDER — ONDANSETRON HCL 4 MG/2ML IJ SOLN
INTRAMUSCULAR | Status: DC | PRN
  Administered 2018-04-04: 4 mg via INTRAVENOUS

## 2018-04-04 MED ORDER — EPHEDRINE SULFATE 50 MG/ML IJ SOLN
INTRAMUSCULAR | Status: AC
  Filled 2018-04-04: qty 1

## 2018-04-04 MED ORDER — ONDANSETRON HCL 4 MG/2ML IJ SOLN: 4.0000 mg | Freq: Four times a day (QID) | INTRAMUSCULAR | Status: DC | PRN

## 2018-04-04 MED ORDER — POVIDONE-IODINE 7.5 % EX SOLN
Freq: Once | CUTANEOUS | Status: DC
  Filled 2018-04-04: qty 118

## 2018-04-04 MED ORDER — DOCUSATE SODIUM 100 MG PO CAPS
100.0000 mg | ORAL_CAPSULE | Freq: Two times a day (BID) | ORAL | Status: DC
  Administered 2018-04-04 – 2018-04-05 (×3): 100 mg via ORAL
  Filled 2018-04-04 (×3): qty 1

## 2018-04-04 MED ORDER — DEXAMETHASONE SODIUM PHOSPHATE 10 MG/ML IJ SOLN
INTRAMUSCULAR | Status: DC | PRN
  Administered 2018-04-04: 10 mg via INTRAVENOUS

## 2018-04-04 MED ORDER — METOCLOPRAMIDE HCL 5 MG/ML IJ SOLN: 5.0000 mg | Freq: Three times a day (TID) | INTRAMUSCULAR | Status: DC | PRN

## 2018-04-04 MED ORDER — CHLORHEXIDINE GLUCONATE 4 % EX LIQD: 60.0000 mL | Freq: Once | CUTANEOUS | Status: DC

## 2018-04-04 MED ORDER — PROPOFOL 10 MG/ML IV BOLUS
INTRAVENOUS | Status: DC | PRN
  Administered 2018-04-04: 30 mg via INTRAVENOUS

## 2018-04-04 MED ORDER — 0.9 % SODIUM CHLORIDE (POUR BTL) OPTIME
TOPICAL | Status: DC | PRN
  Administered 2018-04-04: 1000 mL

## 2018-04-04 MED ORDER — DIPHENHYDRAMINE HCL 12.5 MG/5ML PO ELIX: 12.5000 mg | ORAL_SOLUTION | ORAL | Status: DC | PRN

## 2018-04-04 MED ORDER — METOCLOPRAMIDE HCL 5 MG PO TABS: 5.0000 mg | ORAL_TABLET | Freq: Three times a day (TID) | ORAL | Status: DC | PRN

## 2018-04-04 MED ORDER — FENTANYL CITRATE (PF) 100 MCG/2ML IJ SOLN
INTRAMUSCULAR | Status: DC | PRN
  Administered 2018-04-04 (×2): 25 ug via INTRAVENOUS
  Administered 2018-04-04: 75 ug via INTRAVENOUS
  Administered 2018-04-04: 25 ug via INTRAVENOUS

## 2018-04-04 MED ORDER — CEFAZOLIN SODIUM-DEXTROSE 2-4 GM/100ML-% IV SOLN
2.0000 g | Freq: Four times a day (QID) | INTRAVENOUS | Status: AC
  Administered 2018-04-04 (×2): 2 g via INTRAVENOUS
  Filled 2018-04-04 (×2): qty 100

## 2018-04-04 MED ORDER — GABAPENTIN 300 MG PO CAPS
300.0000 mg | ORAL_CAPSULE | Freq: Every day | ORAL | Status: DC
  Administered 2018-04-04: 300 mg via ORAL
  Filled 2018-04-04: qty 1

## 2018-04-04 MED ORDER — FENTANYL CITRATE (PF) 100 MCG/2ML IJ SOLN: 25.0000 ug | INTRAMUSCULAR | Status: DC | PRN

## 2018-04-04 MED ORDER — ASPIRIN EC 325 MG PO TBEC
325.0000 mg | DELAYED_RELEASE_TABLET | Freq: Every day | ORAL | Status: DC
  Administered 2018-04-05: 325 mg via ORAL
  Filled 2018-04-04: qty 1

## 2018-04-04 MED ORDER — DEXAMETHASONE SODIUM PHOSPHATE 10 MG/ML IJ SOLN
INTRAMUSCULAR | Status: AC
  Filled 2018-04-04: qty 1

## 2018-04-04 MED ORDER — BUPIVACAINE-EPINEPHRINE 0.5% -1:200000 IJ SOLN
INTRAMUSCULAR | Status: DC | PRN
  Administered 2018-04-04: 50 mL

## 2018-04-04 MED ORDER — LIDOCAINE 2% (20 MG/ML) 5 ML SYRINGE
INTRAMUSCULAR | Status: AC
  Filled 2018-04-04: qty 5

## 2018-04-04 MED ORDER — HYDROMORPHONE HCL 1 MG/ML IJ SOLN: 0.5000 mg | INTRAMUSCULAR | Status: DC | PRN

## 2018-04-04 MED ORDER — SODIUM CHLORIDE 0.9 % IR SOLN
Status: DC | PRN
  Administered 2018-04-04: 3000 mL

## 2018-04-04 MED ORDER — PROPOFOL 10 MG/ML IV BOLUS
INTRAVENOUS | Status: AC
  Filled 2018-04-04: qty 40

## 2018-04-04 MED ORDER — SODIUM CHLORIDE 0.9 % IJ SOLN
INTRAMUSCULAR | Status: AC
  Filled 2018-04-04: qty 10

## 2018-04-04 MED ORDER — FENTANYL CITRATE (PF) 250 MCG/5ML IJ SOLN
INTRAMUSCULAR | Status: AC
  Filled 2018-04-04: qty 5

## 2018-04-04 MED ORDER — LACTATED RINGERS IV SOLN
INTRAVENOUS | Status: DC | PRN
  Administered 2018-04-04 (×2): via INTRAVENOUS

## 2018-04-04 MED ORDER — PHENYLEPHRINE HCL 10 MG/ML IJ SOLN
INTRAMUSCULAR | Status: DC | PRN
  Administered 2018-04-04: 80 ug via INTRAVENOUS
  Administered 2018-04-04 (×2): 40 ug via INTRAVENOUS

## 2018-04-04 MED ORDER — BUPIVACAINE LIPOSOME 1.3 % IJ SUSP
20.0000 mL | INTRAMUSCULAR | Status: AC
  Administered 2018-04-04: 20 mL
  Filled 2018-04-04: qty 20

## 2018-04-04 MED ORDER — SODIUM CHLORIDE 0.9 % IJ SOLN
INTRAMUSCULAR | Status: DC | PRN
  Administered 2018-04-04 (×2): 10 mL

## 2018-04-04 MED ORDER — PHENOL 1.4 % MT LIQD: 1.0000 | OROMUCOSAL | Status: DC | PRN

## 2018-04-04 MED ORDER — MEPERIDINE HCL 50 MG/ML IJ SOLN: 6.2500 mg | INTRAMUSCULAR | Status: DC | PRN

## 2018-04-04 MED ORDER — ONDANSETRON HCL 4 MG/2ML IJ SOLN
INTRAMUSCULAR | Status: AC
  Filled 2018-04-04: qty 2

## 2018-04-04 MED ORDER — CEFAZOLIN SODIUM-DEXTROSE 2-4 GM/100ML-% IV SOLN
2.0000 g | INTRAVENOUS | Status: AC
  Administered 2018-04-04: 2 g via INTRAVENOUS

## 2018-04-04 MED ORDER — DEXAMETHASONE SODIUM PHOSPHATE 10 MG/ML IJ SOLN
10.0000 mg | Freq: Three times a day (TID) | INTRAMUSCULAR | Status: DC
  Administered 2018-04-04 – 2018-04-05 (×3): 10 mg via INTRAVENOUS
  Filled 2018-04-04 (×3): qty 1

## 2018-04-04 MED ORDER — PHENYLEPHRINE 40 MCG/ML (10ML) SYRINGE FOR IV PUSH (FOR BLOOD PRESSURE SUPPORT)
PREFILLED_SYRINGE | INTRAVENOUS | Status: AC
  Filled 2018-04-04: qty 10

## 2018-04-04 MED ORDER — EPHEDRINE SULFATE 50 MG/ML IJ SOLN
INTRAMUSCULAR | Status: DC | PRN
  Administered 2018-04-04 (×4): 10 mg via INTRAVENOUS
  Administered 2018-04-04: 5 mg via INTRAVENOUS

## 2018-04-04 MED ORDER — ACETAMINOPHEN 500 MG PO TABS
1000.0000 mg | ORAL_TABLET | Freq: Four times a day (QID) | ORAL | Status: AC
  Administered 2018-04-04 – 2018-04-05 (×4): 1000 mg via ORAL
  Filled 2018-04-04 (×4): qty 2

## 2018-04-04 MED ORDER — PANTOPRAZOLE SODIUM 40 MG PO TBEC
40.0000 mg | DELAYED_RELEASE_TABLET | Freq: Every day | ORAL | Status: DC
  Administered 2018-04-05: 40 mg via ORAL
  Filled 2018-04-04: qty 1

## 2018-04-04 MED ORDER — OXYCODONE HCL 5 MG PO TABS
5.0000 mg | ORAL_TABLET | ORAL | Status: DC | PRN
  Administered 2018-04-04: 5 mg via ORAL
  Filled 2018-04-04: qty 1

## 2018-04-04 MED ORDER — BUPIVACAINE-EPINEPHRINE 0.25% -1:200000 IJ SOLN
INTRAMUSCULAR | Status: AC
  Filled 2018-04-04: qty 1

## 2018-04-04 MED ORDER — CEFAZOLIN SODIUM-DEXTROSE 2-4 GM/100ML-% IV SOLN
INTRAVENOUS | Status: AC
  Filled 2018-04-04: qty 100

## 2018-04-04 MED ORDER — MENTHOL 3 MG MT LOZG: 1.0000 | LOZENGE | OROMUCOSAL | Status: DC | PRN

## 2018-04-04 MED ORDER — ONDANSETRON HCL 4 MG PO TABS
4.0000 mg | ORAL_TABLET | Freq: Four times a day (QID) | ORAL | Status: DC | PRN
Start: 2018-04-04 — End: 2018-04-05

## 2018-04-04 MED ORDER — POLYETHYLENE GLYCOL 3350 17 G PO PACK
17.0000 g | PACK | Freq: Two times a day (BID) | ORAL | Status: DC
  Administered 2018-04-04 – 2018-04-05 (×3): 17 g via ORAL
  Filled 2018-04-04 (×3): qty 1

## 2018-04-04 MED ORDER — ALUM & MAG HYDROXIDE-SIMETH 200-200-20 MG/5ML PO SUSP: 30.0000 mL | ORAL | Status: DC | PRN

## 2018-04-04 SURGICAL SUPPLY — 70 items
BANDAGE ELASTIC 6 VELCRO ST LF (GAUZE/BANDAGES/DRESSINGS) ×3 IMPLANT
BANDAGE ESMARK 6X9 LF (GAUZE/BANDAGES/DRESSINGS) ×1 IMPLANT
BENZOIN TINCTURE PRP APPL 2/3 (GAUZE/BANDAGES/DRESSINGS) ×3 IMPLANT
BLADE SAGITTAL 25.0X1.19X90 (BLADE) ×2 IMPLANT
BLADE SAGITTAL 25.0X1.19X90MM (BLADE) ×1
BLADE SAW SGTL 13X75X1.27 (BLADE) ×3 IMPLANT
BLADE SURG 10 STRL SS (BLADE) ×6 IMPLANT
BNDG ELASTIC 6X15 VLCR STRL LF (GAUZE/BANDAGES/DRESSINGS) ×3 IMPLANT
BNDG ESMARK 6X9 LF (GAUZE/BANDAGES/DRESSINGS) ×3
BOWL SMART MIX CTS (DISPOSABLE) ×3 IMPLANT
CAPT KNEE TOTAL 3 ATTUNE ×3 IMPLANT
CEMENT HV SMART SET (Cement) ×6 IMPLANT
CLOSURE STERI-STRIP 1/2X4 (GAUZE/BANDAGES/DRESSINGS) ×1
CLOSURE WOUND 1/2 X4 (GAUZE/BANDAGES/DRESSINGS) ×1
CLSR STERI-STRIP ANTIMIC 1/2X4 (GAUZE/BANDAGES/DRESSINGS) ×2 IMPLANT
COVER SURGICAL LIGHT HANDLE (MISCELLANEOUS) ×3 IMPLANT
CUFF TOURNIQUET SINGLE 34IN LL (TOURNIQUET CUFF) ×3 IMPLANT
CUFF TOURNIQUET SINGLE 44IN (TOURNIQUET CUFF) IMPLANT
DECANTER SPIKE VIAL GLASS SM (MISCELLANEOUS) ×3 IMPLANT
DRAPE EXTREMITY T 121X128X90 (DRAPE) ×3 IMPLANT
DRAPE HALF SHEET 40X57 (DRAPES) ×6 IMPLANT
DRAPE INCISE IOBAN 66X45 STRL (DRAPES) IMPLANT
DRAPE ORTHO SPLIT 77X108 STRL (DRAPES) ×2
DRAPE SURG ORHT 6 SPLT 77X108 (DRAPES) ×1 IMPLANT
DRAPE U-SHAPE 47X51 STRL (DRAPES) ×3 IMPLANT
DRSG AQUACEL AG ADV 3.5X10 (GAUZE/BANDAGES/DRESSINGS) ×3 IMPLANT
DURAPREP 26ML APPLICATOR (WOUND CARE) ×3 IMPLANT
ELECT CAUTERY BLADE 6.4 (BLADE) ×3 IMPLANT
ELECT REM PT RETURN 9FT ADLT (ELECTROSURGICAL) ×3
ELECTRODE REM PT RTRN 9FT ADLT (ELECTROSURGICAL) ×1 IMPLANT
FACESHIELD WRAPAROUND (MASK) ×3 IMPLANT
GLOVE BIO SURGEON STRL SZ7 (GLOVE) ×3 IMPLANT
GLOVE BIOGEL PI IND STRL 7.0 (GLOVE) ×1 IMPLANT
GLOVE BIOGEL PI IND STRL 7.5 (GLOVE) ×1 IMPLANT
GLOVE BIOGEL PI INDICATOR 7.0 (GLOVE) ×2
GLOVE BIOGEL PI INDICATOR 7.5 (GLOVE) ×2
GLOVE SS BIOGEL STRL SZ 7.5 (GLOVE) ×1 IMPLANT
GLOVE SUPERSENSE BIOGEL SZ 7.5 (GLOVE) ×2
GOWN STRL REUS W/ TWL LRG LVL3 (GOWN DISPOSABLE) ×1 IMPLANT
GOWN STRL REUS W/ TWL XL LVL3 (GOWN DISPOSABLE) ×1 IMPLANT
GOWN STRL REUS W/TWL LRG LVL3 (GOWN DISPOSABLE) ×2
GOWN STRL REUS W/TWL XL LVL3 (GOWN DISPOSABLE) ×2
HANDPIECE INTERPULSE COAX TIP (DISPOSABLE) ×2
HOOD PEEL AWAY FACE SHEILD DIS (HOOD) ×6 IMPLANT
IMMOBILIZER KNEE 22 UNIV (SOFTGOODS) ×3 IMPLANT
KIT BASIN OR (CUSTOM PROCEDURE TRAY) ×3 IMPLANT
KIT TURNOVER KIT B (KITS) ×3 IMPLANT
MANIFOLD NEPTUNE II (INSTRUMENTS) ×3 IMPLANT
MARKER SKIN DUAL TIP RULER LAB (MISCELLANEOUS) ×3 IMPLANT
NEEDLE HYPO 22GX1.5 SAFETY (NEEDLE) ×6 IMPLANT
NS IRRIG 1000ML POUR BTL (IV SOLUTION) ×3 IMPLANT
PACK TOTAL JOINT (CUSTOM PROCEDURE TRAY) ×3 IMPLANT
PAD ARMBOARD 7.5X6 YLW CONV (MISCELLANEOUS) ×6 IMPLANT
SET HNDPC FAN SPRY TIP SCT (DISPOSABLE) ×1 IMPLANT
STRIP CLOSURE SKIN 1/2X4 (GAUZE/BANDAGES/DRESSINGS) ×2 IMPLANT
SUCTION FRAZIER HANDLE 10FR (MISCELLANEOUS) ×2
SUCTION TUBE FRAZIER 10FR DISP (MISCELLANEOUS) ×1 IMPLANT
SUT MNCRL AB 3-0 PS2 18 (SUTURE) ×3 IMPLANT
SUT VIC AB 0 CT1 27 (SUTURE) ×4
SUT VIC AB 0 CT1 27XBRD ANBCTR (SUTURE) ×2 IMPLANT
SUT VIC AB 1 CT1 27 (SUTURE) ×2
SUT VIC AB 1 CT1 27XBRD ANBCTR (SUTURE) ×1 IMPLANT
SUT VIC AB 2-0 CT1 27 (SUTURE) ×4
SUT VIC AB 2-0 CT1 TAPERPNT 27 (SUTURE) ×2 IMPLANT
SYR CONTROL 10ML LL (SYRINGE) ×6 IMPLANT
TOWEL OR 17X24 6PK STRL BLUE (TOWEL DISPOSABLE) ×3 IMPLANT
TOWEL OR 17X26 10 PK STRL BLUE (TOWEL DISPOSABLE) ×3 IMPLANT
TRAY CATH 16FR W/PLASTIC CATH (SET/KITS/TRAYS/PACK) IMPLANT
TRAY FOLEY CATH SILVER 16FR (SET/KITS/TRAYS/PACK) ×3 IMPLANT
WATER STERILE IRR 1000ML POUR (IV SOLUTION) ×3 IMPLANT

## 2018-04-04 NOTE — Progress Notes (Signed)
Patient O2 STATs dropped to 92%on room air while asleep placed on 2 L O2 nasal cannula O2 stats 96%. Will continue to monitor. Ilean SkillVeronica Nakeesha Bowler LPN

## 2018-04-04 NOTE — Progress Notes (Signed)
Orthopedic Tech Progress Note Patient Details:  Danny DarkBobby M [name] 06/03/1930 [id number]  CPM Right Knee CPM Right Knee: On(Simultaneous filing. User may not have seen previous data.) Right Knee Flexion (Degrees): 90(Simultaneous filing. User may not have seen previous data.) Right Knee Extension (Degrees): 0(Simultaneous filing. User may not have seen previous data.) Additional Comments: trapeze bar patient helper  Post Interventions Patient Tolerated: Well Instructions Provided: Care of device  Nikki DomCrawford, Nyia Tsao 04/04/2018, 11:34 AM

## 2018-04-04 NOTE — Progress Notes (Signed)
Orthopedic Tech Progress Note Patient Details:  Danny [name] 11/26/1929 [id number]  CPM Right Knee CPM Right Knee: On Right Knee Flexion (Degrees): 90 Right Knee Extension (Degrees): 0 Additional Comments: trapeze bar patient helper Footsie roll Post Interventions Patient Tolerated: Well Instructions Provided: Care of device  Nikki DomCrawford, Tyronn Golda 04/04/2018, 11:37 AM

## 2018-04-04 NOTE — Anesthesia Preprocedure Evaluation (Addendum)
Anesthesia Evaluation  Patient identified by MRN, date of birth, ID band  Reviewed: Allergy & Precautions, NPO status   Airway Mallampati: II  TM Distance: >3 FB     Dental   Pulmonary neg pulmonary ROS,    Pulmonary exam normal breath sounds clear to auscultation       Cardiovascular hypertension, Normal cardiovascular exam Rhythm:Regular Rate:Normal     Neuro/Psych    GI/Hepatic Neg liver ROS, PUD, GERD  ,  Endo/Other    Renal/GU negative Renal ROS     Musculoskeletal  (+) Arthritis , Osteoarthritis,    Abdominal   Peds  Hematology   Anesthesia Other Findings   Reproductive/Obstetrics                            Anesthesia Physical Anesthesia Plan  ASA: III  Anesthesia Plan: Spinal   Post-op Pain Management:  Regional for Post-op pain   Induction:   PONV Risk Score and Plan: Ondansetron, Dexamethasone and Midazolam  Airway Management Planned: Simple Face Mask  Additional Equipment:   Intra-op Plan:   Post-operative Plan:   Informed Consent: I have reviewed the patients History and Physical, chart, labs and discussed the procedure including the risks, benefits and alternatives for the proposed anesthesia with the patient or authorized representative who has indicated his/her understanding and acceptance.   Dental advisory given  Plan Discussed with: CRNA and Anesthesiologist  Anesthesia Plan Comments:        Anesthesia Quick Evaluation

## 2018-04-04 NOTE — Anesthesia Procedure Notes (Signed)
Spinal  Patient location during procedure: OR Start time: 04/04/2018 7:15 AM End time: 04/04/2018 7:30 AM Staffing Anesthesiologist: Bethena Midgetddono, Ernest, MD Performed: anesthesiologist  Preanesthetic Checklist Completed: patient identified, site marked, surgical consent, pre-op evaluation, timeout performed, IV checked and monitors and equipment checked Spinal Block Patient position: sitting Prep: DuraPrep Patient monitoring: heart rate, cardiac monitor, continuous pulse ox and blood pressure Approach: right paramedian Location: L2-3 Needle Needle gauge: 22 G Assessment Sensory level: T10 Additional Notes Clear CSF. Neg heme . Spinal marcaine, exp checked 3/20. Attempt unsuccessful L34 Dr. Chilton SiGreen. Patient tolerated procedure well. CG

## 2018-04-04 NOTE — Transfer of Care (Signed)
Immediate Anesthesia Transfer of Care Note  Patient: Danny [name]  Procedure(s) Performed: TOTAL KNEE ARTHROPLASTY (Right )  Patient Location: PACU  Anesthesia Type:MAC and Spinal  Level of Consciousness: awake and patient cooperative  Airway & Oxygen Therapy: Patient Spontanous Breathing  Post-op Assessment: Report given to RN and Post -op Vital signs reviewed and stable  Post vital signs: Reviewed and stable  Last Vitals:  Vitals Value Taken Time  BP 138/81 04/04/2018  9:40 AM  Temp    Pulse 80 04/04/2018  9:41 AM  Resp 13 04/04/2018  9:41 AM  SpO2 95 % 04/04/2018  9:41 AM  Vitals shown include unvalidated device data.  Last Pain:  Vitals:   04/04/18 0624  TempSrc: Oral  PainSc:          Complications: No apparent anesthesia complications

## 2018-04-04 NOTE — Discharge Instructions (Signed)

## 2018-04-04 NOTE — Brief Op Note (Signed)
04/04/2018  11:25 AM  PATIENT:  Danny [name]  84 y.o. male  PRE-OPERATIVE DIAGNOSIS:  OA RIGHT KNEE  POST-OPERATIVE DIAGNOSIS:  OA RIGHT KNEE  PROCEDURE:  Procedure(s): TOTAL KNEE ARTHROPLASTY (Right)  SURGEON:  Surgeon(s) and Role:    Salvatore Marvel* Wainer, Robert, MD - Primary  PHYSICIAN ASSISTANT: Margart SicklesJoshua Nur Rabold, PA-C  ASSISTANTS: OR staff x1   ANESTHESIA:   local, regional, spinal and IV sedation  EBL:  20 mL   BLOOD ADMINISTERED:none  DRAINS: none   LOCAL MEDICATIONS USED:  MARCAINE     SPECIMEN:  No Specimen  DISPOSITION OF SPECIMEN:  N/A  COUNTS:  YES  TOURNIQUET:   Total Tourniquet Time Documented: Thigh (Right) - 101 minutes Total: Thigh (Right) - 101 minutes   DICTATION: .Other Dictation: Dictation Number unknown  PLAN OF CARE: Admit for overnight observation  PATIENT DISPOSITION:  PACU - hemodynamically stable.   Delay start of Pharmacological VTE agent (>24hrs) due to surgical blood loss or risk of bleeding: yes

## 2018-04-04 NOTE — Interval H&P Note (Signed)
History and Physical Interval Note:  04/04/2018 6:40 AM  Danny [name]  has presented today for surgery, with the diagnosis of OA RIGHT KNEE  The various methods of treatment have been discussed with the patient and family. After consideration of risks, benefits and other options for treatment, the patient has consented to  Procedure(s): TOTAL KNEE ARTHROPLASTY (Right) as a surgical intervention .  The patient's history has been reviewed, patient examined, no change in status, stable for surgery.  I have reviewed the patient's chart and labs.  Questions were answered to the patient's satisfaction.     Nilda Simmerobert A Tryton Bodi

## 2018-04-04 NOTE — Evaluation (Signed)
Physical Therapy Evaluation Patient Details Name: Danny DarkBobby M Welling MRN: [id number] DOB: 10/01/1929 Today's Date: 04/04/2018   History of Present Illness  Pt is an 84 y/o male s/p elective R TKA. PMH includes HTN, L TKA, and R partial pinky finger amputation.   Clinical Impression  Pt is s/p surgery above with deficits below. Pt tolerated gait training well this session. Required min to min guard A for mobility tasks with RW. Reviewed supine HEP and knee precautions. Per pt, pt's daughter will be assisting at d/c, as pt's wife is unable to physically assist pt. Will continue to follow acutely to maximize functional mobility independence and safety.     Follow Up Recommendations Follow surgeon's recommendation for DC plan and follow-up therapies;Supervision for mobility/OOB    Equipment Recommendations  None recommended by PT    Recommendations for Other Services       Precautions / Restrictions Precautions Precautions: Knee Precaution Booklet Issued: Yes (comment) Precaution Comments: Reviewed knee precautions and supine HEP.  Restrictions Weight Bearing Restrictions: Yes RLE Weight Bearing: Weight bearing as tolerated      Mobility  Bed Mobility Overal bed mobility: Needs Assistance Bed Mobility: Supine to Sit     Supine to sit: Supervision     General bed mobility comments: Supervision for safety. Increased time required.   Transfers Overall transfer level: Needs assistance Equipment used: Rolling walker (2 wheeled) Transfers: Sit to/from Stand Sit to Stand: Min assist;From elevated surface         General transfer comment: Min A for lift assist and steadying from elevated surface. Verbal cues for safe hand placement.   Ambulation/Gait Ambulation/Gait assistance: Min guard Gait Distance (Feet): 50 Feet Assistive device: Rolling walker (2 wheeled) Gait Pattern/deviations: Step-to pattern;Decreased step length - right;Decreased step length - left;Decreased weight  shift to right;Antalgic Gait velocity: Decreased    General Gait Details: Slow, guarded gait. Mild unsteadiness noted, however, no overt LOB noted. Verbal cues for sequencing using RW.   Stairs            Wheelchair Mobility    Modified Rankin (Stroke Patients Only)       Balance Overall balance assessment: Needs assistance Sitting-balance support: No upper extremity supported;Feet supported Sitting balance-Leahy Scale: Good     Standing balance support: Bilateral upper extremity supported;During functional activity Standing balance-Leahy Scale: Poor Standing balance comment: Reliant on BUE support.                              Pertinent Vitals/Pain Pain Assessment: Faces Faces Pain Scale: Hurts a little bit Pain Location: R knee  Pain Descriptors / Indicators: Aching;Operative site guarding Pain Intervention(s): Monitored during session;Limited activity within patient's tolerance;Repositioned    Home Living Family/patient expects to be discharged to:: Private residence Living Arrangements: Spouse/significant other Available Help at Discharge: Family;Available 24 hours/day(Reports daughter will be able to assist at home ) Type of Home: House Home Access: Stairs to enter Entrance Stairs-Rails: None Entrance Stairs-Number of Steps: 1 Home Layout: One level Home Equipment: Scientist, forensicWalker - standard;Walker - 2 wheels;Bedside commode;Walker - 4 wheels Additional Comments: Pt reports he is wife's caregiver, however, daughter will be assisting at home upon d/c.     Prior Function Level of Independence: Independent               Hand Dominance        Extremity/Trunk Assessment   Upper Extremity Assessment Upper Extremity Assessment: Overall WFL for  tasks assessed    Lower Extremity Assessment Lower Extremity Assessment: RLE deficits/detail RLE Deficits / Details: Reports some decreased sensation. Able to perform ther ex below. Deficits consistent with  post op pain and weakness.     Cervical / Trunk Assessment Cervical / Trunk Assessment: Normal  Communication   Communication: No difficulties  Cognition Arousal/Alertness: Awake/alert Behavior During Therapy: WFL for tasks assessed/performed Overall Cognitive Status: Within Functional Limits for tasks assessed                                        General Comments General comments (skin integrity, edema, etc.): Pt's daughter present during session.     Exercises Total Joint Exercises Ankle Circles/Pumps: AROM;Both;20 reps Quad Sets: AROM;Right;10 reps Heel Slides: AROM;Right;10 reps   Assessment/Plan    PT Assessment Patient needs continued PT services  PT Problem List Decreased strength;Decreased balance;Decreased mobility;Decreased knowledge of use of DME;Decreased knowledge of precautions;Pain       PT Treatment Interventions DME instruction;Gait training;Therapeutic activities;Functional mobility training;Stair training;Therapeutic exercise;Balance training;Patient/family education    PT Goals (Current goals can be found in the Care Plan section)  Acute Rehab PT Goals Patient Stated Goal: "to go home tomorrow" PT Goal Formulation: With patient Time For Goal Achievement: 04/18/18 Potential to Achieve Goals: Good    Frequency 7X/week   Barriers to discharge        Co-evaluation               AM-PAC PT "6 Clicks" Daily Activity  Outcome Measure Difficulty turning over in bed (including adjusting bedclothes, sheets and blankets)?: A Little Difficulty moving from lying on back to sitting on the side of the bed? : A Little Difficulty sitting down on and standing up from a chair with arms (e.g., wheelchair, bedside commode, etc,.)?: Unable Help needed moving to and from a bed to chair (including a wheelchair)?: A Little Help needed walking in hospital room?: A Little Help needed climbing 3-5 steps with a railing? : A Lot 6 Click Score: 15     End of Session Equipment Utilized During Treatment: Gait belt Activity Tolerance: Patient tolerated treatment well Patient left: in chair;with call bell/phone within reach;with family/visitor present Nurse Communication: Mobility status PT Visit Diagnosis: Other abnormalities of gait and mobility (R26.89);Pain Pain - Right/Left: Right Pain - part of body: Knee    Time: 1610-9604 PT Time Calculation (min) (ACUTE ONLY): 18 min   Charges:   PT Evaluation $PT Eval Low Complexity: 1 Low          Gladys Damme, PT, DPT  Acute Rehabilitation Services  Pager: [phone]   Lehman Prom 04/04/2018, 2:29 PM

## 2018-04-04 NOTE — Anesthesia Postprocedure Evaluation (Signed)
Anesthesia Post Note  Patient: Cherie DarkBobby M Sandall  Procedure(s) Performed: TOTAL KNEE ARTHROPLASTY (Right )     Patient location during evaluation: PACU Anesthesia Type: Spinal Level of consciousness: oriented and awake and alert Pain management: pain level controlled Vital Signs Assessment: post-procedure vital signs reviewed and stable Respiratory status: spontaneous breathing, respiratory function stable and patient connected to nasal cannula oxygen Cardiovascular status: blood pressure returned to baseline and stable Postop Assessment: no headache, no backache and no apparent nausea or vomiting Anesthetic complications: no    Last Vitals:  Vitals:   04/04/18 0704 04/04/18 0706  BP:    Pulse: (!) 59   Resp: 14 11  Temp:    SpO2: 100%     Last Pain:  Vitals:   04/04/18 0624  TempSrc: Oral  PainSc:                  Kailani Brass

## 2018-04-04 NOTE — Anesthesia Procedure Notes (Addendum)
Anesthesia Regional Block: Adductor canal block   Pre-Anesthetic Checklist: ,, timeout performed, Correct Patient, Correct Site, Correct Laterality, Correct Procedure, Correct Position, site marked, Risks and benefits discussed,  Surgical consent,  Pre-op evaluation,  At surgeon's request and post-op pain management  Laterality: Right  Prep: chloraprep       Needles:   Needle Type: Stimulator Needle - 80          Additional Needles:   Procedures: Doppler guided,,,, ultrasound used (permanent image in chart),,,,  Narrative:  Start time: 04/04/2018 6:55 AM End time: 04/04/2018 7:10 AM  Performed by: Personally  Anesthesiologist: Dorris SinghGreen, Tatsuya Okray, MD

## 2018-04-04 NOTE — Op Note (Signed)
MRN:     [id number] DOB/AGE:    10/23/1929 / 84 y.o.       OPERATIVE REPORT   DATE OF PROCEDURE:  04/04/2018      PREOPERATIVE DIAGNOSIS:   Primary Localized Osteoarthritis right Knee       Estimated body mass index is 28.06 kg/m as calculated from the following:   Height as of 03/24/18: 5\' 9"  (1.753 m).   Weight as of this encounter: 86.2 kg (190 lb).                                                       POSTOPERATIVE DIAGNOSIS:   Same                                                                 PROCEDURE:  Procedure(s): TOTAL KNEE ARTHROPLASTY Using Depuy Attune RP implants #8 Femur, #8Tibia, 7mm  RP bearing, 38 Patella    SURGEON: Julus Kelley A. Thurston HoleWainer, MD   ASSISTANT: Margart SicklesJoshua Chadwell, PA-C, present and scrubbed throughout the case, critical for retraction, instrumentation, and closure.  ANESTHESIA: Spinal with Adductor Nerve Block  TOURNIQUET TIME: 65 minutes   COMPLICATIONS:  None       SPECIMENS: None   INDICATIONS FOR PROCEDURE: The patient has djd of the knee with varus deformities, XR shows bone on bone arthritis. Patient has failed all conservative measures including anti-inflammatory medicines, narcotics, attempts at exercise and weight loss, cortisone injections and viscosupplementation.  Risks and benefits of surgery have been discussed, questions answered.    DESCRIPTION OF PROCEDURE: The patient identified by armband, received right adductor canal block and IV antibiotics[facility] Patient taken to the operating room, appropriate anesthetic monitors were attached. Spinal anesthesia induced with the patient in supine position, Foley catheter was inserted. Tourniquet applied high to the operative thigh. Lateral post and foot positioner applied to the table, the lower extremity was then prepped and draped in usual sterile fashion from the ankle to the tourniquet. Time-out procedure was performed. The limb was wrapped with an Esmarch bandage  and the tourniquet inflated to 365 mmHg.   We began the operation by making a 6cm anterior midline incision. Small bleeders in the skin and the subcutaneous tissue identified and cauterized. Transverse retinaculum was incised and reflected medially and a medial parapatellar arthrotomy was accomplished. the patella was everted and theprepatellar fat pad resected. The superficial medial collateral ligament was then elevated from anterior to posterior along the proximal flare of the tibia and anterior half of the menisci resected. The knee was hyperflexed exposing bone on bone arthritis. Peripheral and notch osteophytes as well as the cruciate ligaments were then resected. We continued to work our way around posteriorly along the proximal tibia, and externally rotated the tibia subluxing it out from underneath the femur. A McHale retractor was placed through the notch and a lateral Hohmann retractor placed, and an external tibial guide was placed.  The tibial cutting guide was pinned into place allowing resection of 4 mm of bone medially and about 6 mm of bone laterally because of  her varus deformity.   Satisfied with the tibial resection, we then entered the distal femur 2 mm anterior to the PCL origin with the intramedullary guide rod and applied the distal femoral cutting guide set at 11mm, with 5 degrees of valgus. This was pinned along the epicondylar axis. At this point, the distal femoral cut was accomplished without difficulty. We then sized for a 8 femoral component and pinned the guide in 3 degrees of external rotation.The chamfer cutting guide was pinned into place. The anterior, posterior, and chamfer cuts were accomplished without difficulty followed by the  RP box cutting guide and the box cut. We also removed posterior osteophytes from the posterior femoral condyles. At this time, the knee was brought into full extension. We checked our extension and flexion gaps and found them symmetric at 7.  The  patella thickness measured at 46m m. We set the cutting guide at 15 and removed the posterior patella sized for 38 button and drilled the lollipop. The knee was then once again hyperflexed exposing the proximal tibia. We sized for a # 8 tibial base plate, applied the smokestack and the conical reamer followed by the the Delta fin keel punch. We then hammered into place the  RP trial femoral component, inserted a trial bearing, trial patellar button, and took the knee through range of motion from 0-130 degrees. No thumb pressure was required for patellar tracking.   At this point, all trial components were removed, a double batch of DePuy HV cement  was mixed and applied to all bony metallic mating surfaces. In order, we hammered into place the tibial tray and removed excess cement, the femoral component and removed excess cement, a 7 mm  RP bearing was inserted, and the knee brought to full extension with compression. The patellar button was clamped into place, and excess cement removed. While the cement cured the wound was irrigated out with normal saline solution pulse lavage, and exparel was injected throughout the knee. Ligament stability and patellar tracking were checked and found to be excellent..   The parapatellar arthrotomy was closed with  #1 Vicryl suture. The subcutaneous tissue with 0 and 2-0 undyed Vicryl suture, and 4-0 Monocryl.. A dressing of Aquaseal, 4 x 4, dressing sponges, Webril, and Ace wrap applied. Needle and sponge count were correct times 2.The patient awakened, extubated, and taken to recovery room without difficulty. Vascular status was normal, pulses 2+ and symmetric.    Nilda Simmer 11/29/2017, 8:56 AM

## 2018-04-05 ENCOUNTER — Encounter (HOSPITAL_COMMUNITY): Payer: Self-pay | Admitting: Orthopedic Surgery

## 2018-04-05 DIAGNOSIS — Z9049 Acquired absence of other specified parts of digestive tract: Secondary | ICD-10-CM | POA: Diagnosis not present

## 2018-04-05 DIAGNOSIS — M25561 Pain in right knee: Secondary | ICD-10-CM | POA: Diagnosis present

## 2018-04-05 DIAGNOSIS — Z823 Family history of stroke: Secondary | ICD-10-CM | POA: Diagnosis not present

## 2018-04-05 DIAGNOSIS — K219 Gastro-esophageal reflux disease without esophagitis: Secondary | ICD-10-CM | POA: Diagnosis present

## 2018-04-05 DIAGNOSIS — I10 Essential (primary) hypertension: Secondary | ICD-10-CM | POA: Diagnosis present

## 2018-04-05 DIAGNOSIS — Z8249 Family history of ischemic heart disease and other diseases of the circulatory system: Secondary | ICD-10-CM | POA: Diagnosis not present

## 2018-04-05 DIAGNOSIS — Z820 Family history of epilepsy and other diseases of the nervous system: Secondary | ICD-10-CM | POA: Diagnosis not present

## 2018-04-05 DIAGNOSIS — Z96652 Presence of left artificial knee joint: Secondary | ICD-10-CM | POA: Diagnosis present

## 2018-04-05 DIAGNOSIS — M1711 Unilateral primary osteoarthritis, right knee: Secondary | ICD-10-CM | POA: Diagnosis present

## 2018-04-05 DIAGNOSIS — E785 Hyperlipidemia, unspecified: Secondary | ICD-10-CM | POA: Diagnosis present

## 2018-04-05 LAB — CBC
HCT: 34.1 % — ABNORMAL LOW (ref 39.0–52.0)
HEMOGLOBIN: 11.4 g/dL — AB (ref 13.0–17.0)
MCH: 30 pg (ref 26.0–34.0)
MCHC: 33.4 g/dL (ref 30.0–36.0)
MCV: 89.7 fL (ref 78.0–100.0)
PLATELETS: 134 10*3/uL — AB (ref 150–400)
RBC: 3.8 MIL/uL — AB (ref 4.22–5.81)
RDW: 13.3 % (ref 11.5–15.5)
WBC: 18.5 10*3/uL — AB (ref 4.0–10.5)

## 2018-04-05 LAB — BASIC METABOLIC PANEL
ANION GAP: 7 (ref 5–15)
BUN: 18 mg/dL (ref 8–23)
CHLORIDE: 104 mmol/L (ref 98–111)
CO2: 23 mmol/L (ref 22–32)
Calcium: 8.4 mg/dL — ABNORMAL LOW (ref 8.9–10.3)
Creatinine, Ser: 1.16 mg/dL (ref 0.61–1.24)
GFR calc non Af Amer: 55 mL/min — ABNORMAL LOW (ref >60)
Glucose, Bld: 150 mg/dL — ABNORMAL HIGH (ref 70–99)
POTASSIUM: 4.9 mmol/L (ref 3.5–5.1)
SODIUM: 134 mmol/L — AB (ref 135–145)

## 2018-04-05 MED ORDER — DOCUSATE SODIUM 100 MG PO CAPS: 100.0000 mg | ORAL_CAPSULE | Freq: Two times a day (BID) | ORAL | 0 refills | Status: DC

## 2018-04-05 MED ORDER — ASPIRIN 325 MG PO TBEC: 325.0000 mg | DELAYED_RELEASE_TABLET | Freq: Every day | ORAL | 0 refills | Status: DC

## 2018-04-05 MED ORDER — OXYCODONE HCL 5 MG PO TABS: ORAL_TABLET | ORAL | 0 refills | Status: DC

## 2018-04-05 MED ORDER — POLYETHYLENE GLYCOL 3350 17 G PO PACK: PACK | ORAL | 0 refills | Status: DC

## 2018-04-05 NOTE — Progress Notes (Addendum)
Discharge instructions reviewed with pt and daughter.  Copy of instructions given to pt, pt scripts sent into his pharmacy electronically by MD. Both verbalized understanding. Handouts given with instructions of new meds and ortho care.   At 1225 Pt d/c'd via wheelchair with belongings, with daughter.        Escorted by hospital volunteer services.

## 2018-04-05 NOTE — Progress Notes (Signed)
Physical Therapy Treatment Patient Details Name: Danny [name] MRN: [id number] DOB: [birth year] Today's Date: 04/05/2018    History of Present Illness Pt is an 84 y/o male s/p elective R TKA. PMH includes HTN, L TKA, and R partial pinky finger amputation.     PT Comments    Pt is progressing well with gait and mobility, supervision overall with min guard assist for safety on stairs simulating home entry.  The entire HEP program was reviewed and pt is physically ready for d/c home with family's supervision.    Follow Up Recommendations  Follow surgeon's recommendation for DC plan and follow-up therapies;Supervision for mobility/OOB     Equipment Recommendations  None recommended by PT    Recommendations for Other Services   NA     Precautions / Restrictions Precautions Precautions: Knee Precaution Comments: knee exercises reviewed, knee precaution reviewed Restrictions RLE Weight Bearing: Weight bearing as tolerated    Mobility  Bed Mobility               General bed mobility comments: Pt was OOB in the recliner chair.    Transfers Overall transfer level: Needs assistance Equipment used: Rolling walker (2 wheeled) Transfers: Sit to/from Stand Sit to Stand: Supervision         General transfer comment: supervision for safety  Ambulation/Gait Ambulation/Gait assistance: Supervision Gait Distance (Feet): 200 Feet Assistive device: Rolling walker (2 wheeled) Gait Pattern/deviations: Step-through pattern;Antalgic     General Gait Details: Pt with mildly antalgic gait pattern, verbal cues for heel to toe pattern, upright posture and closer proximity to RW.    Stairs Stairs: Yes Stairs assistance: Min guard Stair Management: No rails;Two rails;Alternating pattern;Step to pattern;Forwards;With walker Number of Stairs: 7 General stair comments: Practiced curb step x 2 with RW and then step with railing.  Pt was able to preform both reciprocal pattern and step  to pattern safely.              Balance Overall balance assessment: Needs assistance Sitting-balance support: Feet supported;No upper extremity supported Sitting balance-Leahy Scale: Good     Standing balance support: Single extremity supported;Bilateral upper extremity supported;No upper extremity supported Standing balance-Leahy Scale: Fair                              Cognition Arousal/Alertness: Awake/alert Behavior During Therapy: WFL for tasks assessed/performed Overall Cognitive Status: Within Functional Limits for tasks assessed                                        Exercises Total Joint Exercises Ankle Circles/Pumps: AROM;Both;20 reps Quad Sets: AROM;Right;10 reps Towel Squeeze: AROM;Both;10 reps Short Arc Quad: AROM;Right;10 reps Heel Slides: AROM;Right;10 reps Hip ABduction/ADduction: AROM;Right;10 reps Straight Leg Raises: AROM;Right;10 reps Long Arc Quad: AROM;Right;10 reps Knee Flexion: AROM;AAROM;Right;10 reps Goniometric ROM: 10-95    General Comments   NA      Pertinent Vitals/Pain Pain Assessment: Faces Faces Pain Scale: Hurts little more Pain Location: R knee  Pain Descriptors / Indicators: Aching;Operative site guarding Pain Intervention(s): Limited activity within patient's tolerance;Monitored during session;Repositioned           PT Goals (current goals can now be found in the care plan section) Acute Rehab PT Goals Patient Stated Goal: to go home today Progress towards PT goals: Progressing toward goals    Frequency  7X/week      PT Plan Current plan remains appropriate       AM-PAC PT "6 Clicks" Daily Activity  Outcome Measure  Difficulty turning over in bed (including adjusting bedclothes, sheets and blankets)?: A Little Difficulty moving from lying on back to sitting on the side of the bed? : A Little Difficulty sitting down on and standing up from a chair with arms (e.g., wheelchair,  bedside commode, etc,.)?: None Help needed moving to and from a bed to chair (including a wheelchair)?: None Help needed walking in hospital room?: None Help needed climbing 3-5 steps with a railing? : A Little 6 Click Score: 21    End of Session   Activity Tolerance: Patient tolerated treatment well Patient left: in chair;with call bell/phone within reach;with family/visitor present Nurse Communication: Mobility status PT Visit Diagnosis: Other abnormalities of gait and mobility (R26.89);Pain Pain - Right/Left: Right Pain - part of body: Knee     Time: [id number] PT Time Calculation (min) (ACUTE ONLY): 18 min  Charges:  $Gait Training: 8-22 mins          Sharda Keddy B. Smt. Loder, PT, DPT [phone]             04/05/2018, 9:41 PM

## 2018-04-05 NOTE — Discharge Summary (Signed)
Patient ID: Danny [name] MRN: [id number] DOB/AGE: 82/01/1930 82 y.o.  Admit date: 04/04/2018 Discharge date: 04/05/2018  Admission Diagnoses:  Principal Problem:   Primary osteoarthritis of right knee Active Problems:   GERD (gastroesophageal reflux disease)   Hyperlipidemia   Essential hypertension   S/P total knee replacement, left   Primary localized osteoarthritis of right knee   S/P total knee replacement, right   Primary localized osteoarthrosis of the knee, right   Discharge Diagnoses:  Same  Past Medical History:  Diagnosis Date  . Essential hypertension    a. Dr. Welton FlakesKhan in [place] managing.  Previously on antihypertensive, which was later d/c'd.  Marland Kitchen. GERD (gastroesophageal reflux disease)   . Hyperlipidemia   . Primary localized osteoarthritis of left knee    patient had left total knee 04/27/16  . Primary localized osteoarthritis of right knee   . PUD (peptic ulcer disease)    a. Remote h/o coffee grounds emesis followed by GI eval.  On Nexium since.  . S/P total knee replacement, left     Surgeries: Procedure(s): TOTAL KNEE ARTHROPLASTY on 04/04/2018   Consultants:   Discharged Condition: Improved  Hospital Course: Danny [name] is an 84 y.o. male who was admitted 04/04/2018 for operative treatment ofPrimary osteoarthritis of right knee. Patient has severe unremitting pain that affects sleep, daily activities, and work/hobbies. After pre-op clearance the patient was taken to the operating room on 04/04/2018 and underwent  Procedure(s): TOTAL KNEE ARTHROPLASTY.    Patient was given perioperative antibiotics:  Anti-infectives (From admission, onward)   Start     Dose/Rate Route Frequency Ordered Stop   04/04/18 1330  ceFAZolin (ANCEF) IVPB 2g/100 mL premix     2 g 200 mL/hr over 30 Minutes Intravenous Every 6 hours 04/04/18 1152 04/04/18 2159   04/04/18 0600  ceFAZolin (ANCEF) IVPB 2g/100 mL premix     2 g 200 mL/hr over 30 Minutes Intravenous To  [facility] 04/04/18 0554 04/04/18 0759   04/04/18 0559  ceFAZolin (ANCEF) 2-4 GM/100ML-% IVPB    Note to Pharmacy:  Edwina BarthHawks, Pamela   : cabinet override      04/04/18 0559 04/04/18 0729       Patient was given sequential compression devices, early ambulation, and chemoprophylaxis to prevent DVT.  Patient benefited maximally from hospital stay and there were no complications.    Recent vital signs:  Patient Vitals for the past 24 hrs:  BP Temp Temp src Pulse Resp SpO2 Height Weight  04/05/18 0540 105/64 98 F (36.7 C) Oral 76 15 96 % - -  04/04/18 1945 90/61 97.9 F (36.6 C) Oral 87 14 94 % - -  04/04/18 1800 (!) 156/107 (!) 97.5 F (36.4 C) Axillary 90 16 - 5' 9.02" (1.753 m) 86.2 kg (190 lb)  04/04/18 1300 (!) 156/107 (!) 97.5 F (36.4 C) Axillary 90 16 96 % - -  04/04/18 1124 (!) 151/94 - - 82 10 96 % - -  04/04/18 1110 139/89 - - 80 11 96 % - -  04/04/18 1055 (!) 149/88 - - 78 19 98 % - -  04/04/18 1039 (!) 143/93 - - 74 13 97 % - -  04/04/18 1025 (!) 150/89 - - 74 10 97 % - -  04/04/18 1009 (!) 149/93 - - 72 (!) 23 97 % - -  04/04/18 0955 139/88 - - 81 17 96 % - -  04/04/18 0940 138/81 97.6 F (36.4 C) - 83 (!) 8 95 % - -  Recent laboratory studies:  Recent Labs    04/05/18 0448  WBC 18.5*  HGB 11.4*  HCT 34.1*  PLT 134*  NA 134*  K 4.9  CL 104  CO2 23  BUN 18  CREATININE 1.16  GLUCOSE 150*  CALCIUM 8.4*     Discharge Medications:   Allergies as of 04/05/2018      Reactions   No Known Allergies       Medication List    TAKE these medications   aspirin 325 MG EC tablet Take 1 tablet (325 mg total) by mouth daily with breakfast.   docusate sodium 100 MG capsule Commonly known as:  COLACE Take 1 capsule (100 mg total) by mouth 2 (two) times daily.   esomeprazole 40 MG capsule Commonly known as:  NEXIUM Take 40 mg by mouth daily before breakfast.   oxyCODONE 5 MG immediate release tablet Commonly known as:  Oxy IR/ROXICODONE 1 po q 4  hrs prn pain.  Patient had a right total knee replacement on 04/04/2018   polyethylene glycol packet Commonly known as:  MIRALAX / GLYCOLAX 17grams in 6 oz of water twice a day until bowel movement.  LAXITIVE.  Restart if two days since last bowel movement   pravastatin 40 MG tablet Commonly known as:  PRAVACHOL Take 40 mg by mouth daily at 12 noon.            Discharge Care Instructions  (From admission, onward)        Start     Ordered   04/05/18 0000  Change dressing    Comments:  DO NOT REMOVE BANDAGE OVER SURGICAL INCISION.  WASH WHOLE LEG INCLUDING OVER THE WATERPROOF BANDAGE WITH SOAP AND WATER EVERY DAY.   04/05/18 0849      Diagnostic Studies: No results found.  Disposition: Discharge disposition: 01-Home or Self Care       Discharge Instructions    CPM   Complete by:  As directed    Continuous passive motion machine (CPM):      Use the CPM from 0 to 90 for 6 hours per day.       You may break it up into 2 or 3 sessions per day.      Use CPM for 2 weeks or until you are told to stop.   Call MD / Call 911   Complete by:  As directed    If you experience chest pain or shortness of breath, CALL 911 and be transported to the hospital emergency room.  If you develope a fever above 101 F, pus (white drainage) or increased drainage or redness at the wound, or calf pain, call your surgeon's office.   Change dressing   Complete by:  As directed    DO NOT REMOVE BANDAGE OVER SURGICAL INCISION.  WASH WHOLE LEG INCLUDING OVER THE WATERPROOF BANDAGE WITH SOAP AND WATER EVERY DAY.   Constipation Prevention   Complete by:  As directed    Drink plenty of fluids.  Prune juice may be helpful.  You may use a stool softener, such as Colace (over the counter) 100 mg twice a day.  Use MiraLax (over the counter) for constipation as needed.   Diet - low sodium heart healthy   Complete by:  As directed    Discharge instructions   Complete by:  As directed    INSTRUCTIONS AFTER  JOINT REPLACEMENT   Remove items at home which could result in a fall. This includes throw rugs  or furniture in walking pathways ICE to the affected joint every three hours while awake for 30 minutes at a time, for at least the first 3-5 days, and then as needed for pain and swelling.  Continue to use ice for pain and swelling. You may notice swelling that will progress down to the foot and ankle.  This is normal after surgery.  Elevate your leg when you are not up walking on it.   Continue to use the breathing machine you got in the hospital (incentive spirometer) which will help keep your temperature down.  It is common for your temperature to cycle up and down following surgery, especially at night when you are not up moving around and exerting yourself.  The breathing machine keeps your lungs expanded and your temperature down.   DIET:  As you were doing prior to hospitalization, we recommend a well-balanced diet.  DRESSING / WOUND CARE / SHOWERING  Keep the surgical dressing until follow up.  The dressing is water proof, so you can shower without any extra covering.  IF THE DRESSING FALLS OFF or the wound gets wet inside, change the dressing with sterile gauze.  Please use good hand washing techniques before changing the dressing.  Do not use any lotions or creams on the incision until instructed by your surgeon.    ACTIVITY  Increase activity slowly as tolerated, but follow the weight bearing instructions below.   No driving for 6 weeks or until further direction given by your physician.  You cannot drive while taking narcotics.  No lifting or carrying greater than 10 lbs. until further directed by your surgeon. Avoid periods of inactivity such as sitting longer than an hour when not asleep. This helps prevent blood clots.  You may return to work once you are authorized by your doctor.     WEIGHT BEARING   Weight bearing as tolerated with assist device (walker, cane, etc) as directed,  use it as long as suggested by your surgeon or therapist, typically at least 2-3 weeks.   EXERCISES  Results after joint replacement surgery are often greatly improved when you follow the exercise, range of motion and muscle strengthening exercises prescribed by your doctor. Safety measures are also important to protect the joint from further injury. Any time any of these exercises cause you to have increased pain or swelling, decrease what you are doing until you are comfortable again and then slowly increase them. If you have problems or questions, call your caregiver or physical therapist for advice.   Rehabilitation is important following a joint replacement. After just a few days of immobilization, the muscles of the leg can become weakened and shrink (atrophy).  These exercises are designed to build up the tone and strength of the thigh and leg muscles and to improve motion. Often times heat used for twenty to thirty minutes before working out will loosen up your tissues and help with improving the range of motion but do not use heat for the first two weeks following surgery (sometimes heat can increase post-operative swelling).   These exercises can be done on a training (exercise) mat, on the floor, on a table or on a bed. Use whatever works the best and is most comfortable for you.    Use music or television while you are exercising so that the exercises are a pleasant break in your day. This will make your life better with the exercises acting as a break in your routine that you can  look forward to.   Perform all exercises about fifteen times, three times per day or as directed.  You should exercise both the operative leg and the other leg as well.   Exercises include:  Quad Sets - Tighten up the muscle on the front of the thigh (Quad) and hold for 5-10 seconds.   Straight Leg Raises - With your knee straight (if you were given a brace, keep it on), lift the leg to 60 degrees, hold for 3  seconds, and slowly lower the leg.  Perform this exercise against resistance later as your leg gets stronger.  Leg Slides: Lying on your back, slowly slide your foot toward your buttocks, bending your knee up off the floor (only go as far as is comfortable). Then slowly slide your foot back down until your leg is flat on the floor again.  Angel Wings: Lying on your back spread your legs to the side as far apart as you can without causing discomfort.  Hamstring Strength:  Lying on your back, push your heel against the floor with your leg straight by tightening up the muscles of your buttocks.  Repeat, but this time bend your knee to a comfortable angle, and push your heel against the floor.  You may put a pillow under the heel to make it more comfortable if necessary.   A rehabilitation program following joint replacement surgery can speed recovery and prevent re-injury in the future due to weakened muscles. Contact your doctor or a physical therapist for more information on knee rehabilitation.    CONSTIPATION  Constipation is defined medically as fewer than three stools per week and severe constipation as less than one stool per week.  Even if you have a regular bowel pattern at home, your normal regimen is likely to be disrupted due to multiple reasons following surgery.  Combination of anesthesia, postoperative narcotics, change in appetite and fluid intake all can affect your bowels.   YOU MUST use at least one of the following options; they are listed in order of increasing strength to get the job done.  They are all available over the counter, and you may need to use some, POSSIBLY even all of these options:    Drink plenty of fluids (prune juice may be helpful) and high fiber foods Colace 100 mg by mouth twice a day  Senokot for constipation as directed and as needed Dulcolax (bisacodyl), take with full glass of water  Miralax (polyethylene glycol) once or twice a day as needed.  If you  have tried all these things and are unable to have a bowel movement in the first 3-4 days after surgery call either your surgeon or your primary doctor.    If you experience loose stools or diarrhea, hold the medications until you stool forms back up.  If your symptoms do not get better within 1 week or if they get worse, check with your doctor.  If you experience "the worst abdominal pain ever" or develop nausea or vomiting, please contact the office immediately for further recommendations for treatment.   ITCHING:  If you experience itching with your medications, try taking only a single pain pill, or even half a pain pill at a time.  You can also use Benadryl over the counter for itching or also to help with sleep.   TED HOSE STOCKINGS:  Use stockings on both legs until for at least 2 weeks or as directed by physician office. They may be removed at night for  sleeping.  MEDICATIONS:  See your medication summary on the "After Visit Summary" that nursing will review with you.  You may have some home medications which will be placed on hold until you complete the course of blood thinner medication.  It is important for you to complete the blood thinner medication as prescribed.  PRECAUTIONS:  If you experience chest pain or shortness of breath - call 911 immediately for transfer to the hospital emergency department.   If you develop a fever greater that 101 F, purulent drainage from wound, increased redness or drainage from wound, foul odor from the wound/dressing, or calf pain - CONTACT YOUR SURGEON.                                                   FOLLOW-UP APPOINTMENTS:  If you do not already have a post-op appointment, please call the office for an appointment to be seen by your surgeon.  Guidelines for how soon to be seen are listed in your "After Visit Summary", but are typically between 1-4 weeks after surgery.  OTHER INSTRUCTIONS:   Knee Replacement:  Do not place pillow under knee, focus  on keeping the knee straight while resting. CPM instructions: 0-90 degrees, 2 hours in the morning, 2 hours in the afternoon, and 2 hours in the evening. Place foam block, curve side up under heel at all times except when in CPM or when walking.  DO NOT modify, tear, cut, or change the foam block in any way.  MAKE SURE YOU:  Understand these instructions.  Get help right away if you are not doing well or get worse.    Thank you for letting us be a part of your medical care team.  It is a privilege we respect greatly.  We hope these instructions will help you stay on track for a fast and full recovery!   Do not put a pillow under the knee. Place it under the heel.   Complete by:  As directed    Place blue foam block, curve side up under heel at all times except when in CPM or when walking.  DO NOT modify, tear, cut, or change in any way the blue foam block.   Increase activity slowly as tolerated   Complete by:  As directed    Patient may shower   Complete by:  As directed    Aquacel dressing is water proof    Wash over it and the whole leg with soap and water at the end of your shower   TED hose   Complete by:  As directed    Use stockings (TED hose) for 2 weeks on both leg(s).  You may remove them at night for sleeping.      Follow-up Information    [facility] On 04/18/2018.   Why:  Initial eval is 8/12 at 10:30 arrive at 10:15 next appointment is 8/15 at 10 am Contact information: [address] [place], [place]  [address]       Salvatore Marvel, MD. Schedule an appointment as soon as possible for a visit in 2 weeks.   Specialty:  Orthopedic Surgery Why:  or as previously scheduled Contact information: [address] [place] [address] [phone]            Signed: Pascal Lux  04/05/2018, 8:50 AM

## 2018-04-05 NOTE — Plan of Care (Signed)
  Problem: Pain Managment: Goal: General experience of comfort will improve Outcome: Progressing   

## 2018-04-06 NOTE — Addendum Note (Signed)
Addendum  created 04/06/18 2145 by Dorris SinghGreen, Annina Piotrowski, MD   Image imported, Intraprocedure Blocks edited, Sign clinical note

## 2018-04-18 ENCOUNTER — Ambulatory Visit: Payer: Self-pay | Admitting: Nurse Practitioner

## 2018-05-30 ENCOUNTER — Encounter: Payer: Self-pay | Admitting: Nurse Practitioner

## 2018-05-30 ENCOUNTER — Ambulatory Visit (INDEPENDENT_AMBULATORY_CARE_PROVIDER_SITE_OTHER): Payer: Medicare Other | Admitting: Nurse Practitioner

## 2018-05-30 VITALS — BP 130/84 | HR 65 | Resp 16 | Ht 71.0 in | Wt 196.0 lb

## 2018-05-30 DIAGNOSIS — M1711 Unilateral primary osteoarthritis, right knee: Secondary | ICD-10-CM | POA: Diagnosis not present

## 2018-05-30 DIAGNOSIS — K219 Gastro-esophageal reflux disease without esophagitis: Secondary | ICD-10-CM | POA: Diagnosis not present

## 2018-05-30 DIAGNOSIS — E782 Mixed hyperlipidemia: Secondary | ICD-10-CM

## 2018-05-30 DIAGNOSIS — Z23 Encounter for immunization: Secondary | ICD-10-CM

## 2018-05-30 NOTE — Progress Notes (Signed)
[facility] [address] [place], [place] [address]  Internal MEDICINE  Office Visit Note  Patient Name: Danny [name]  [id number]  [id number]  Date of Service: 05/30/2018  Chief Complaint  Patient presents with  . Hyperlipidemia  . Hypertension    Patient had right total knee replacement 03/2018. He is doing well. Right knee still has some swelling. Walking well.  Wife passed away 05-13-18. 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 He does have two children, five grandchildren, and severn great-grandchildren, who live close by to help him.       Current Medication: Outpatient Encounter Medications as of 05/30/2018  Medication Sig  . aspirin EC 325 MG EC tablet Take 1 tablet (325 mg total) by mouth daily with breakfast.  . docusate sodium (COLACE) 100 MG capsule Take 1 capsule (100 mg total) by mouth 2 (two) times daily.  Marland Kitchen esomeprazole (NEXIUM) 40 MG capsule Take 40 mg by mouth daily before breakfast.  . oxyCODONE (OXY IR/ROXICODONE) 5 MG immediate release tablet 1 po q 4 hrs prn pain.  Patient had a right total knee replacement on 04/04/2018  . polyethylene glycol (MIRALAX / GLYCOLAX) packet 17grams in 6 oz of water twice a day until bowel movement.  LAXITIVE.  Restart if two days since last bowel movement  . pravastatin (PRAVACHOL) 40 MG tablet Take 40 mg by mouth daily at 12 noon.    No facility-administered encounter medications on file as of 05/30/2018.     Surgical History: Past Surgical History:  Procedure Laterality Date  . CHOLECYSTECTOMY  2003  . 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 REPLACEMENT TOTAL KNEE Right   . TOTAL KNEE ARTHROPLASTY Left 04/27/2016   Procedure: LEFT TOTAL KNEE ARTHROPLASTY;  Surgeon: Salvatore Marvel, MD;  Location[facility] OR;  Service: Orthopedics;  Laterality: Left;  . TOTAL KNEE ARTHROPLASTY Right 04/04/2018   Procedure: TOTAL KNEE ARTHROPLASTY;  Surgeon: Salvatore Marvel, MD;  Location: [facility] OR;   Service: Orthopedics;  Laterality: Right;    Medical History: Past Medical History:  Diagnosis Date  . Essential hypertension    a. Dr. Welton Flakes in [place] managing.  Previously on antihypertensive, which was later d/c'd.  Marland Kitchen GERD (gastroesophageal reflux disease)   . Hyperlipidemia   . Primary localized osteoarthritis of left knee    patient had left total knee 04/27/16  . Primary localized osteoarthritis of right knee   . PUD (peptic ulcer disease)    a. Remote h/o coffee grounds emesis followed by GI eval.  On Nexium since.  . S/P total knee replacement, left     Family History: Family History  Problem Relation Age of Onset  . Parkinson's disease Mother        died @ 63  . Pneumonia Father        died in his 45's  . Heart disease Sister        1/2 sister - currently 75.  Marland Kitchen Hypertension Sister   . Stroke Sister     Social History   Socioeconomic History  . Marital status: Married    Spouse name: Not on file  . Number of children: Not on file  . Years of education: Not on file  . Highest education level: Not on file  Occupational History    Comment: Previously worked in [employer] in [employer]  Social Needs  . Financial resource strain: Not on file  . Food insecurity:  Worry: Not on file    Inability: Not on file  . Transportation needs:    Medical: Not on file    Non-medical: Not on file  Tobacco Use  . Smoking status: Never Smoker  . Smokeless tobacco: Former Engineer, [employer]  Substance and Sexual Activity  . Alcohol use: No    Frequency: Never  . Drug use: No  . Sexual activity: Not on file  Lifestyle  . Physical activity:    Days per week: Not on file    Minutes per session: Not on file  . Stress: Not on file  Relationships  . Social connections:    Talks on phone: Not on file    Gets together: Not on file    Attends religious service: Not on file    Active member of club or organization: Not on file    Attends meetings of clubs or organizations: Not on  file    Relationship status: Not on file  . Intimate partner violence:    Fear of current or ex partner: Not on file    Emotionally abused: Not on file    Physically abused: Not on file    Forced sexual activity: Not on file  Other Topics Concern  . Not on file  Social History Narrative   Married takes care of wife who has Parkinson's and Early Alzheimers.  Fairly active.      Review of Systems  Constitutional: Negative for activity change, chills, fatigue and unexpected weight change.  HENT: Negative for congestion, postnasal drip, rhinorrhea, sneezing and sore throat.        Improving  Eyes: Negative.  Negative for redness.  Respiratory: Negative for cough, chest tightness, shortness of breath and wheezing.   Cardiovascular: Negative for chest pain and palpitations.  Gastrointestinal: Negative for abdominal pain, constipation, diarrhea, nausea and vomiting.  Endocrine: Negative for cold intolerance, heat intolerance, polydipsia, polyphagia and polyuria.  Genitourinary: Negative.  Negative for dysuria and frequency.  Musculoskeletal: Positive for arthralgias. Negative for back pain, joint swelling and neck pain.       Had right knee replacement in late July. Right knee slightly swollen and a little tender. Gradually improving.   Skin: Negative for rash.  Allergic/Immunologic: Negative for environmental allergies and immunocompromised state.  Neurological: Negative for dizziness, tremors, numbness and headaches.  Hematological: Negative for adenopathy. Does not bruise/bleed easily.  Psychiatric/Behavioral: Negative for behavioral problems (Depression), sleep disturbance and suicidal ideas. The patient is not nervous/anxious.     Today's Vitals   05/30/18 1051  BP: 130/84  Pulse: 65  Resp: 16  SpO2: 96%  Weight: 196 lb (88.9 kg)  Height: 5\' 11"  (1.803 m)    Physical Exam  Constitutional: He is oriented to person, place, and time. He appears well-developed and  well-nourished. No distress.  HENT:  Head: Normocephalic and atraumatic.  Nose: Nose normal.  Mouth/Throat: Oropharynx is clear and moist. No oropharyngeal exudate.  Eyes: Pupils are equal, round, and reactive to light. Conjunctivae and EOM are normal.  Neck: Normal range of motion. Neck supple. No JVD present. Carotid bruit is not present. No tracheal deviation present. No thyromegaly present.  Cardiovascular: Normal rate and intact distal pulses. An irregular rhythm present. Exam reveals no gallop and no friction rub.  Murmur heard.  Systolic murmur is present with a grade of 2/6. ECG today is within normal limits  Pulmonary/Chest: Effort normal and breath sounds normal. No respiratory distress. He has no wheezes. He has no rales. He exhibits  no tenderness.  Abdominal: Soft. Bowel sounds are normal. There is no tenderness.  Musculoskeletal: Normal range of motion.  Right knee tender and slightly swollen. Walking with slight limp, favoring the right side.   Lymphadenopathy:    He has no cervical adenopathy.  Neurological: He is alert and oriented to person, place, and time. No cranial nerve deficit. Coordination normal.  Skin: Skin is warm and dry. He is not diaphoretic.  Psychiatric: He has a normal mood and affect. His behavior is normal. Judgment and thought content normal.  Nursing note and vitals reviewed.  Assessment/Plan: 1. Gastroesophageal reflux disease without esophagitis Continue nexium as needed and as prescribed.   2. Mixed hyperlipidemia Well managed. Continue statin as prescribed.   3. Primary osteoarthritis of right knee Improving after knee replacement in 03/2018. Monitor closely.   4. Flu vaccine need - Flu Vaccine MDCK QUAD PF  General Counseling: Raven verbalizes understanding of the findings of todays visit and agrees with plan of treatment. I have discussed any further diagnostic evaluation that may be needed or ordered today. We also reviewed his medications  today. he has been encouraged to call the office with any questions or concerns that should arise related to todays visit.  This patient was seen by Vincent Gros FNP Collaboration with Dr Lyndon Code as a part of collaborative care agreement  Orders Placed This Encounter  Procedures  . Flu Vaccine MDCK QUAD PF     Time spent: 63 Minutes      Dr Lyndon Code Internal medicine

## 2018-07-16 ENCOUNTER — Other Ambulatory Visit: Payer: Self-pay | Admitting: Internal Medicine

## 2018-07-23 ENCOUNTER — Other Ambulatory Visit: Payer: Self-pay | Admitting: Internal Medicine

## 2018-09-22 ENCOUNTER — Encounter: Payer: Self-pay | Admitting: Nurse Practitioner

## 2018-09-22 ENCOUNTER — Ambulatory Visit: Payer: Medicare Other | Admitting: Nurse Practitioner

## 2018-09-22 VITALS — BP 152/104 | HR 89 | Resp 16 | Ht 71.0 in | Wt 199.4 lb

## 2018-09-22 DIAGNOSIS — I10 Essential (primary) hypertension: Secondary | ICD-10-CM | POA: Diagnosis not present

## 2018-09-22 DIAGNOSIS — N390 Urinary tract infection, site not specified: Secondary | ICD-10-CM | POA: Insufficient documentation

## 2018-09-22 DIAGNOSIS — M545 Low back pain, unspecified: Secondary | ICD-10-CM

## 2018-09-22 DIAGNOSIS — R319 Hematuria, unspecified: Secondary | ICD-10-CM

## 2018-09-22 LAB — POCT URINALYSIS DIPSTICK
BILIRUBIN UA: NEGATIVE
Glucose, UA: NEGATIVE
KETONES UA: NEGATIVE
Leukocytes, UA: NEGATIVE
NITRITE UA: NEGATIVE
PROTEIN UA: POSITIVE — AB
SPEC GRAV UA: 1.01 (ref 1.010–1.025)
Urobilinogen, UA: 1 E.U./dL
pH, UA: 7 (ref 5.0–8.0)

## 2018-09-22 MED ORDER — IBUPROFEN 600 MG PO TABS: 600.0000 mg | ORAL_TABLET | Freq: Three times a day (TID) | ORAL | 2 refills | Status: DC | PRN

## 2018-09-22 MED ORDER — SULFAMETHOXAZOLE-TRIMETHOPRIM 800-160 MG PO TABS: 1.0000 | ORAL_TABLET | Freq: Two times a day (BID) | ORAL | 0 refills | Status: DC

## 2018-09-22 NOTE — Progress Notes (Signed)
[facility] [address] [place], [place] [address]  Internal MEDICINE  Office Visit Note  Patient Name: Danny [name]  [id number]  [id number]  Date of Service: 09/22/2018   Pt is here for a sick visit.   Chief Complaint  Patient presents with  . Pain    burning pain lower left back side, pain has been going on for three weeks     The patient is reporting left sided pain, just above the hip.Marland Kitchen Has been going on for about three weeks. Did have some lower back pain which has improved. Pain is described as burning. He cannot think of anything that makes the pain better or worse. He denies pain with urination or abdominal pain. He does not recall any injury or trauma or heavy lifting which would contribute to this pain. Denies nausea, vomiting, or diarrhea.        Current Medication:  Outpatient Encounter Medications as of 09/22/2018  Medication Sig  . esomeprazole (NEXIUM) 40 MG capsule TAKE 1 CAPSULE BY MOUTH EVERY DAY  . pravastatin (PRAVACHOL) 40 MG tablet TAKE 1 TABLET BY MOUTH EVERYDAY AT BEDTIME  . aspirin EC 325 MG EC tablet Take 1 tablet (325 mg total) by mouth daily with breakfast. (Patient not taking: Reported on 09/22/2018)  . docusate sodium (COLACE) 100 MG capsule Take 1 capsule (100 mg total) by mouth 2 (two) times daily. (Patient not taking: Reported on 09/22/2018)  . ibuprofen (ADVIL,MOTRIN) 600 MG tablet Take 1 tablet (600 mg total) by mouth every 8 (eight) hours as needed.  . polyethylene glycol (MIRALAX / GLYCOLAX) packet 17grams in 6 oz of water twice a day until bowel movement.  LAXITIVE.  Restart if two days since last bowel movement (Patient not taking: Reported on 09/22/2018)  . sulfamethoxazole-trimethoprim (BACTRIM DS,SEPTRA DS) 800-160 MG tablet Take 1 tablet by mouth 2 (two) times daily.   No facility-administered encounter medications on file as of 09/22/2018.       Medical History: Past Medical History:  Diagnosis Date  . Essential  hypertension    a. Dr. Welton Flakes in [place] managing.  Previously on antihypertensive, which was later d/c'd.  Marland Kitchen GERD (gastroesophageal reflux disease)   . Hyperlipidemia   . Primary localized osteoarthritis of left knee    patient had left total knee 04/27/16  . Primary localized osteoarthritis of right knee   . PUD (peptic ulcer disease)    a. Remote h/o coffee grounds emesis followed by GI eval.  On Nexium since.  . S/P total knee replacement, left     Today's Vitals   09/22/18 1047  BP: (!) 152/104  Pulse: 89  Resp: 16  SpO2: 98%  Weight: 199 lb 6.4 oz (90.4 kg)  Height: 5\' 11"  (1.803 m)    Review of Systems  Constitutional: Negative for activity change, chills, fatigue and unexpected weight change.  HENT: Negative for congestion, postnasal drip, rhinorrhea, sneezing and sore throat.   Respiratory: Negative for cough, chest tightness and shortness of breath.   Cardiovascular: Negative for chest pain and palpitations.       Bp elevated today.  Gastrointestinal: Negative for abdominal pain, constipation, diarrhea, nausea and vomiting.  Genitourinary: Positive for flank pain. Negative for dysuria, frequency, hematuria and penile pain.  Musculoskeletal: Positive for back pain. Negative for arthralgias, joint swelling and neck pain.       Lower back, mostly lower left side, just above the hip. Describes the pain as burning sensation.   Skin: Negative for  rash.  Neurological: Negative for tremors, numbness and headaches.  Hematological: Negative for adenopathy. Does not bruise/bleed easily.  Psychiatric/Behavioral: Negative for behavioral problems (Depression), sleep disturbance and suicidal ideas. The patient is not nervous/anxious.     Physical Exam Vitals signs and nursing note reviewed.  Constitutional:      General: He is not in acute distress.    Appearance: Normal appearance. He is well-developed. He is not diaphoretic.  HENT:     Head: Normocephalic and atraumatic.      Mouth/Throat:     Pharynx: No oropharyngeal exudate.  Eyes:     Pupils: Pupils are equal, round, and reactive to light.  Neck:     Musculoskeletal: Normal range of motion and neck supple.     Thyroid: No thyromegaly.     Vascular: No JVD.     Trachea: No tracheal deviation.  Cardiovascular:     Rate and Rhythm: Normal rate and regular rhythm.     Heart sounds: Normal heart sounds. No murmur. No friction rub. No gallop.   Pulmonary:     Effort: Pulmonary effort is normal. No respiratory distress.     Breath sounds: Normal breath sounds. No wheezing or rales.  Chest:     Chest wall: No tenderness.  Abdominal:     General: Bowel sounds are normal.     Palpations: Abdomen is soft.     Tenderness: There is no abdominal tenderness.  Genitourinary:    Comments: U/a positive for protein and trace blood.  Musculoskeletal: Normal range of motion.        General: Tenderness present.     Comments: Mild tenderness just above the hip, left side of lower back. No swelling or bruising present. No evidence of abnormality or bony deformity present.   Lymphadenopathy:     Cervical: No cervical adenopathy.  Skin:    General: Skin is warm and dry.  Neurological:     Mental Status: He is alert and oriented to person, place, and time.     Cranial Nerves: No cranial nerve deficit.  Psychiatric:        Behavior: Behavior normal.        Thought Content: Thought content normal.        Judgment: Judgment normal.   Assessment/Plan: 1. Urinary tract infection with hematuria, site unspecified U/a positive for protein and trace blood. Treat with bactrim DS twice daily for 7 days. Send urine for culture and sensitivity and adjust medication as indicated.  - sulfamethoxazole-trimethoprim (BACTRIM DS,SEPTRA DS) 800-160 MG tablet; Take 1 tablet by mouth 2 (two) times daily.  Dispense: 14 tablet; Refill: 0 - CULTURE, URINE COMPREHENSIVE  2. Left low back pain, unspecified chronicity, unspecified whether  sciatica present Ibuprofen 600mg  may be taken up to three times daily as needed for pain.  - POCT Urinalysis Dipstick - ibuprofen (ADVIL,MOTRIN) 600 MG tablet; Take 1 tablet (600 mg total) by mouth every 8 (eight) hours as needed.  Dispense: 30 tablet; Refill: 2  3. Essential hypertension Generally controlled through diet. Will continue to monitor.   General Counseling: Brody verbalizes understanding of the findings of todays visit and agrees with plan of treatment. I have discussed any further diagnostic evaluation that may be needed or ordered today. We also reviewed his medications today. he has been encouraged to call the office with any questions or concerns that should arise related to todays visit.    Counseling:  This patient was seen by Vincent Gros FNP Collaboration with Dr Tora Kindred  Modena NunneryM Khan as a part of collaborative care agreement  Orders Placed This Encounter  Procedures  . CULTURE, URINE COMPREHENSIVE  . POCT Urinalysis Dipstick    Meds ordered this encounter  Medications  . sulfamethoxazole-trimethoprim (BACTRIM DS,SEPTRA DS) 800-160 MG tablet    Sig: Take 1 tablet by mouth 2 (two) times daily.    Dispense:  14 tablet    Refill:  0    Order Specific Question:   Supervising Provider    Answer:   Lyndon CodeKHAN, FOZIA M [[id number]]  . ibuprofen (ADVIL,MOTRIN) 600 MG tablet    Sig: Take 1 tablet (600 mg total) by mouth every 8 (eight) hours as needed.    Dispense:  30 tablet    Refill:  2    Order Specific Question:   Supervising Provider    Answer:   Lyndon CodeKHAN, FOZIA M [[id number]]    Time spent: 25 Minutes

## 2018-09-25 LAB — CULTURE, URINE COMPREHENSIVE

## 2018-12-21 IMAGING — CR DG KNEE 1-2V*R*
1 series · 2 of 2 positions shown · non-contrast
Comparison: 02/21/2016 plain film exam.

CLINICAL DATA: 88-year-old male with right knee pain progressively
worse over the past year. Left knee surgery 1607. No left knee pain
at this time. No injury. Initial encounter.

EXAM:
BILATERAL KNEES STANDING - 1 VIEW; RIGHT KNEE - 1-2 VIEW

[Series 1: dg knee 1-2 views right · 0.14mm/px · 2 of 2 slices shown]
[im 1/2]
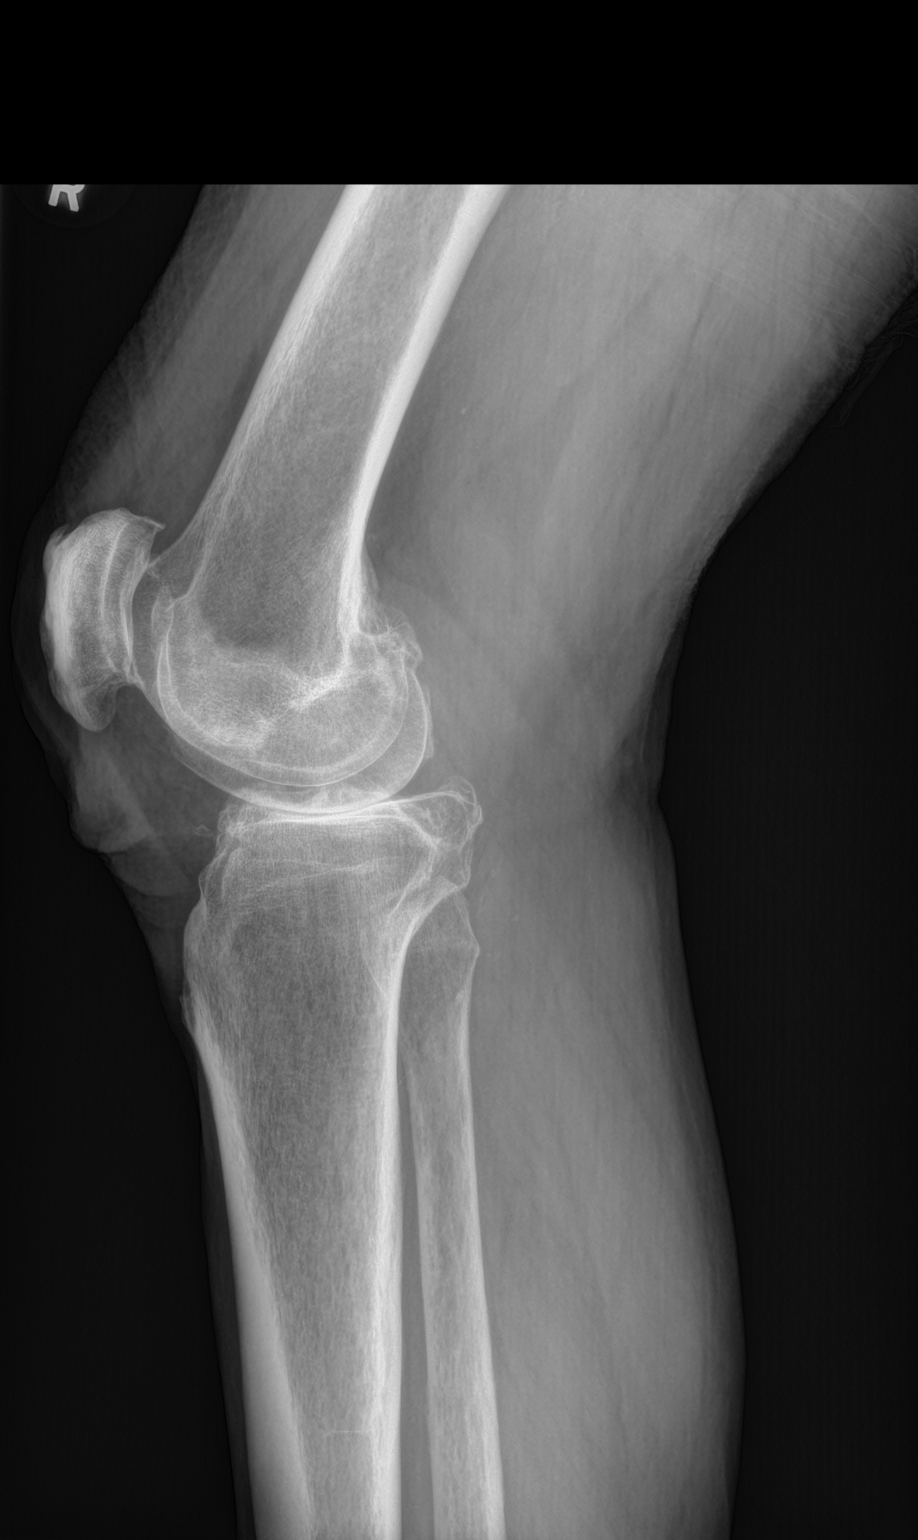
[im 2/2]
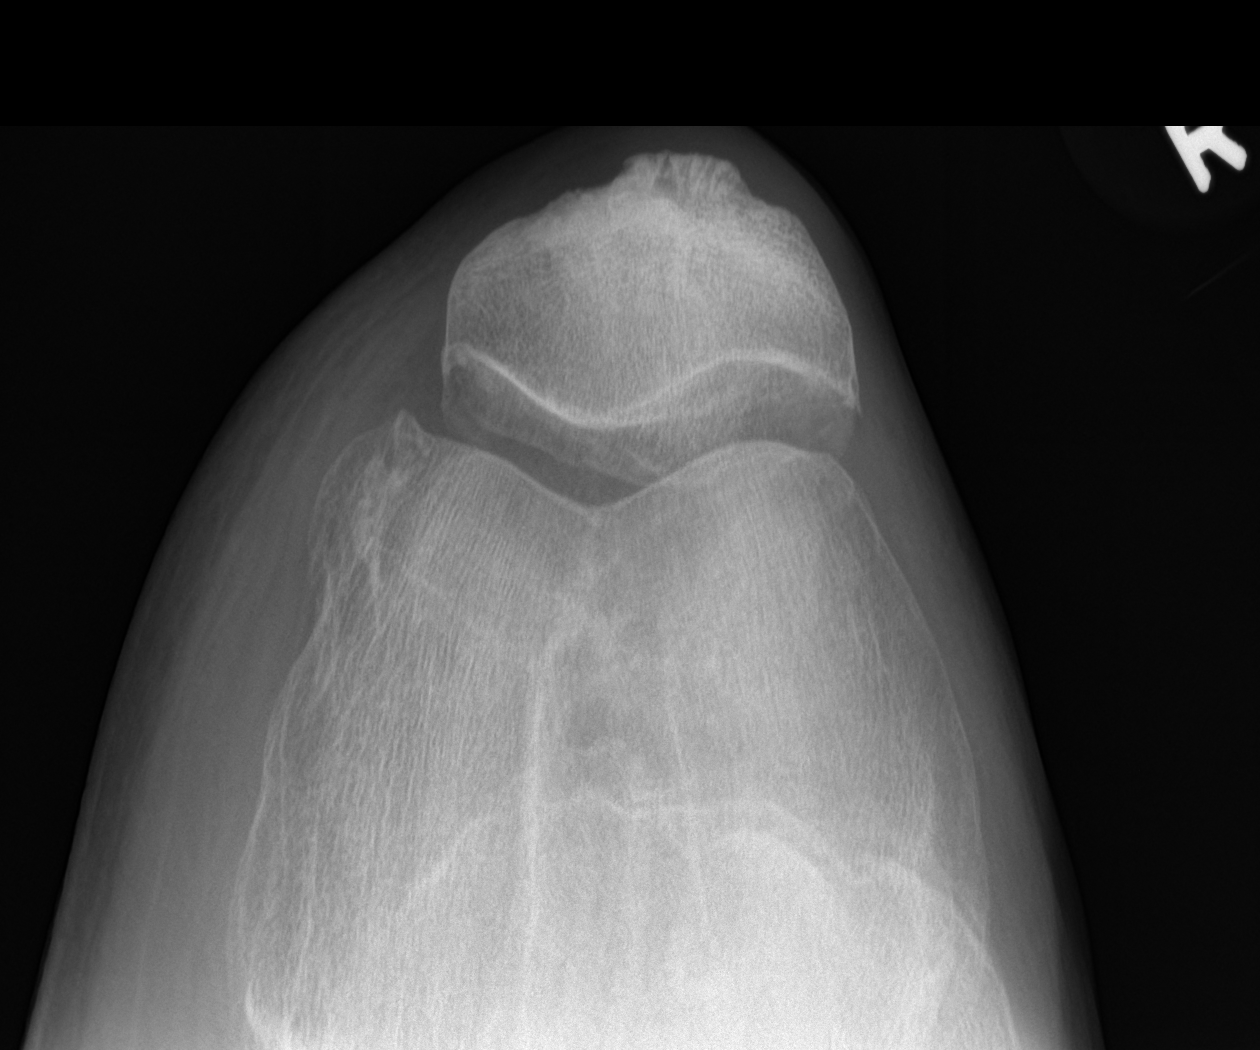

[2 of 2 positions shown; findings below may reference images not displayed]

FINDINGS: Right knee 4 views:

Marked right medial tibiofemoral joint degenerative changes.

Moderate right patellofemoral joint degenerative changes.

No fracture or dislocation.

Small right suprapatellar joint effusion.

Left knee two views:

Post total left knee replacement which appears in satisfactory
position on frontal projection.
IMPRESSION: Marked right medial tibiofemoral joint degenerative changes.

Moderate right patellofemoral joint degenerative changes.

Small right suprapatellar joint effusion.

Post total left knee replacement which appears in satisfactory
position on frontal projection.

## 2019-04-20 ENCOUNTER — Other Ambulatory Visit: Payer: Self-pay | Admitting: Nurse Practitioner

## 2019-04-20 MED ORDER — PRAVASTATIN SODIUM 40 MG PO TABS: ORAL_TABLET | ORAL | 3 refills | Status: DC

## 2019-05-29 ENCOUNTER — Other Ambulatory Visit: Payer: Self-pay

## 2019-05-29 MED ORDER — ESOMEPRAZOLE MAGNESIUM 40 MG PO CPDR
DELAYED_RELEASE_CAPSULE | ORAL | 3 refills | Status: DC
Start: 2019-05-29 — End: 2019-11-20

## 2019-06-02 ENCOUNTER — Other Ambulatory Visit: Payer: Self-pay

## 2019-06-02 ENCOUNTER — Ambulatory Visit (INDEPENDENT_AMBULATORY_CARE_PROVIDER_SITE_OTHER): Payer: Medicare Other | Admitting: Nurse Practitioner

## 2019-06-02 ENCOUNTER — Encounter: Payer: Self-pay | Admitting: Nurse Practitioner

## 2019-06-02 VITALS — BP 162/90 | HR 76 | Temp 98.4°F | Resp 16 | Ht 71.0 in | Wt 197.0 lb

## 2019-06-02 DIAGNOSIS — Z23 Encounter for immunization: Secondary | ICD-10-CM

## 2019-06-02 DIAGNOSIS — M545 Low back pain, unspecified: Secondary | ICD-10-CM

## 2019-06-02 DIAGNOSIS — I10 Essential (primary) hypertension: Secondary | ICD-10-CM | POA: Diagnosis not present

## 2019-06-02 DIAGNOSIS — Z0001 Encounter for general adult medical examination with abnormal findings: Secondary | ICD-10-CM | POA: Diagnosis not present

## 2019-06-02 DIAGNOSIS — R300 Dysuria: Secondary | ICD-10-CM | POA: Diagnosis not present

## 2019-06-02 NOTE — Progress Notes (Signed)
[facility] [place], [place] [address]  Internal MEDICINE  Office Visit Note  Patient Name: Danny [name]  [id number]  [id number]  Date of Service: 06/04/2019  Chief Complaint  Patient presents with  . Annual Exam  . Hyperlipidemia  . Gastroesophageal Reflux     Mr. Engram presents to clinic for an annual wellness exam. He denies any acute complaints today, and states that he mows his and his neighbor's lawn for exercise. He does report chronic low back pain, for which he occasionally takes acetaminophen. He also reports intermittent mild L flank pain, but denies any dysuria. Mr. Haaland's BP is elevated at 170's over 90's. He states that office visits make him slightly nervous. He denies chest pain, chest pressure, or shortness of breath. He is due to have routine, fasting labs ordered.   Pt is here for routine health maintenance examination  Current Medication: Outpatient Encounter Medications as of 06/02/2019  Medication Sig  . esomeprazole (NEXIUM) 40 MG capsule TAKE 1 CAPSULE BY MOUTH EVERY DAY  . pravastatin (PRAVACHOL) 40 MG tablet TAKE 1 TABLET BY MOUTH EVERYDAY AT BEDTIME  . [DISCONTINUED] aspirin EC 325 MG EC tablet Take 1 tablet (325 mg total) by mouth daily with breakfast. (Patient not taking: Reported on 06/02/2019)  . [DISCONTINUED] docusate sodium (COLACE) 100 MG capsule Take 1 capsule (100 mg total) by mouth 2 (two) times daily. (Patient not taking: Reported on 06/02/2019)  . [DISCONTINUED] ibuprofen (ADVIL,MOTRIN) 600 MG tablet Take 1 tablet (600 mg total) by mouth every 8 (eight) hours as needed. (Patient not taking: Reported on 06/02/2019)  . [DISCONTINUED] polyethylene glycol (MIRALAX / GLYCOLAX) packet 17grams in 6 oz of water twice a day until bowel movement.  LAXITIVE.  Restart if two days since last bowel movement (Patient not taking: Reported on 06/02/2019)  . [DISCONTINUED] sulfamethoxazole-trimethoprim (BACTRIM DS,SEPTRA DS)  800-160 MG tablet Take 1 tablet by mouth 2 (two) times daily. (Patient not taking: Reported on 06/02/2019)   No facility-administered encounter medications on file as of 06/02/2019.     Surgical History: Past Surgical History:  Procedure Laterality Date  . CHOLECYSTECTOMY  2003  . 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 REPLACEMENT TOTAL KNEE Right   . TOTAL KNEE ARTHROPLASTY Left 04/27/2016   Procedure: LEFT TOTAL KNEE ARTHROPLASTY;  Surgeon: Elsie Saas, MD;  Location: [place];  Service: Orthopedics;  Laterality: Left;  . TOTAL KNEE ARTHROPLASTY Right 04/04/2018   Procedure: TOTAL KNEE ARTHROPLASTY;  Surgeon: Elsie Saas, MD;  Location: [place];  Service: Orthopedics;  Laterality: Right;    Medical History: Past Medical History:  Diagnosis Date  . Essential hypertension    a. Dr. Humphrey Rolls in [place] managing.  Previously on antihypertensive, which was later d/c'd.  Marland Kitchen GERD (gastroesophageal reflux disease)   . Hyperlipidemia   . Primary localized osteoarthritis of left knee    patient had left total knee 04/27/16  . Primary localized osteoarthritis of right knee   . PUD (peptic ulcer disease)    a. Remote h/o coffee grounds emesis followed by GI eval.  On Nexium since.  . S/P total knee replacement, left     Family History: Family History  Problem Relation Age of Onset  . Parkinson's disease Mother        died @ 63  . Pneumonia Father        died in his 42's  . Heart disease Sister  1/2 sister - currently 94.  Marland Kitchen Hypertension Sister   . Stroke Sister       Review of Systems  Constitutional: Negative for activity change, fatigue and unexpected weight change.  HENT: Negative for congestion, postnasal drip, rhinorrhea, sinus pressure, sinus pain and sore throat.   Respiratory: Negative for cough and wheezing.   Cardiovascular: Negative for chest pain and palpitations.       Elevated blood pressure today.  Gastrointestinal: Negative for abdominal pain,  constipation, diarrhea and nausea.  Endocrine: Negative for cold intolerance, heat intolerance, polydipsia and polyuria.  Genitourinary: Negative for flank pain and frequency.  Musculoskeletal: Positive for back pain.  Skin: Negative.   Allergic/Immunologic: Negative for environmental allergies.  Neurological: Negative for dizziness and headaches.  Hematological: Negative for adenopathy.  Psychiatric/Behavioral: Negative for dysphoric mood. The patient is not nervous/anxious.      Today's Vitals   06/02/19 1006  BP: (!) 162/90  Pulse: 76  Resp: 16  Temp: 98.4 F (36.9 C)  SpO2: 97%  Weight: 197 lb (89.4 kg)  Height: 5\' 11"  (1.803 m)   Body mass index is 27.48 kg/m.  Physical Exam Vitals signs and nursing note reviewed.  Constitutional:      Appearance: Normal appearance.  HENT:     Head: Normocephalic and atraumatic.     Right Ear: External ear normal.     Left Ear: External ear normal.     Nose: Nose normal.  Eyes:     Pupils: Pupils are equal, round, and reactive to light.  Neck:     Musculoskeletal: Normal range of motion and neck supple.     Vascular: No carotid bruit.  Cardiovascular:     Rate and Rhythm: Normal rate. Rhythm irregular.     Pulses: Normal pulses.          Radial pulses are 2+ on the right side and 2+ on the left side.       Dorsalis pedis pulses are 2+ on the right side and 2+ on the left side.     Heart sounds: Normal heart sounds.  Pulmonary:     Effort: Pulmonary effort is normal.     Breath sounds: Normal breath sounds.  Abdominal:     General: Abdomen is flat. Bowel sounds are normal.     Palpations: Abdomen is soft.     Tenderness: There is no abdominal tenderness.  Musculoskeletal:     Right lower leg: No edema.     Left lower leg: No edema.     Comments: Mild, intermittent lower back pain. Worse with exertion. No abnormalities or deformities noted today.  Skin:    General: Skin is warm and dry.  Neurological:     General: No  focal deficit present.     Mental Status: He is alert and oriented to person, place, and time.     Gait: Gait is intact.     Deep Tendon Reflexes:     Reflex Scores:      Patellar reflexes are 2+ on the right side and 2+ on the left side. Psychiatric:        Mood and Affect: Mood normal.        Behavior: Behavior normal.        Thought Content: Thought content normal.        Judgment: Judgment normal.    Depression screen [facility] 2/9 06/02/2019 09/22/2018 05/30/2018 09/27/2017  Decreased Interest 0 0 0 0  Down, Depressed, Hopeless 0 0 0 0  PHQ - 2 Score 0 0 0 0    Functional Status Survey: Is the patient deaf or have difficulty hearing?: Yes Does the patient have difficulty seeing, even when wearing glasses/contacts?: No Does the patient have difficulty concentrating, remembering, or making decisions?: No Does the patient have difficulty walking or climbing stairs?: No Does the patient have difficulty dressing or bathing?: No Does the patient have difficulty doing errands alone such as visiting a doctor's office or shopping?: No  MMSE - Mini Mental State Exam 06/02/2019  Orientation to time 5  Orientation to Place 5  Registration 3  Attention/ Calculation 5  Recall 3  Language- name 2 objects 2  Language- repeat 1  Language- follow 3 step command 3  Language- read & follow direction 1  Write a sentence 1  Copy design 1  Total score 30    Fall Risk  06/02/2019 09/22/2018 05/30/2018 09/27/2017  Falls in the past year? 0 0 No No     LABS: Recent Results (from the past 2160 hour(s))  UA/M w/rflx Culture, Routine     Status: Abnormal (Preliminary result)   Collection Time: 06/02/19 10:07 AM   Specimen: Urine   URINE  Result Value Ref Range   Specific Gravity, UA 1.021 1.005 - 1.030   pH, UA 5.0 5.0 - 7.5   Color, UA Yellow Yellow   Appearance Ur Clear Clear   Leukocytes,UA 3+ (A) Negative   Protein,UA Trace Negative/Trace   Glucose, UA Negative Negative   Ketones, UA  Negative Negative   RBC, UA Negative Negative   Bilirubin, UA Negative Negative   Urobilinogen, Ur 1.0 0.2 - 1.0 mg/dL   Nitrite, UA Negative Negative   Microscopic Examination See below:     Comment: Microscopic was indicated and was performed.   Urinalysis Reflex Comment     Comment: This specimen has reflexed to a Urine Culture.  Microscopic Examination     Status: Abnormal   Collection Time: 06/02/19 10:07 AM   URINE  Result Value Ref Range   WBC, UA >30 (A) 0 - 5 /hpf    Comment: Clumps of leukocytes present.   RBC 0-2 0 - 2 /hpf   Epithelial Cells (non renal) 0-10 0 - 10 /hpf   Casts None seen None seen /lpf   Mucus, UA Present Not Estab.   Bacteria, UA Few None seen/Few  Urine Culture, Reflex     Status: None (Preliminary result)   Collection Time: 06/02/19 10:07 AM   URINE  Result Value Ref Range   Urine Culture, Routine WILL FOLLOW     Assessment/Plan: 1. Encounter for general adult medical examination with abnormal findings Annual health maintenance exam today. Routine, fasting labs ordered.   2. Essential hypertension Generally stable without medication. Patient to begin monitoring his blood pressures at home every day. Will contact the officde if it persistently runs >150 systolic and >90 diastolic. Mr. Romeo AppleHarrison instructed on normal blood pressure ranges, encouraged to monitor blood pressure daily at home while relaxing, and instructed to keep a log of BP readings. Mr. Domingo MadeiraHarrison verbalizes understanding and reports that he has a home BP monitor. Will follow-up with Mr. Romeo AppleHarrison in 2 weeks to reevaluate BP and review home BP log.   3. Left low back pain, unspecified chronicity, unspecified whether sciatica present May continue to take OTC tylenol as needed and as indicated for back pain.   4. Flu vaccine need - Flu Vaccine MDCK QUAD PF  5. Dysuria - UA/M w/rflx  Culture, Routine  General Counseling: Jevon verbalizes understanding of the findings of todays visit and  agrees with plan of treatment. I have discussed any further diagnostic evaluation that may be needed or ordered today. We also reviewed his medications today. he has been encouraged to call the office with any questions or concerns that should arise related to todays visit.    Counseling:  Hypertension Counseling:   The following hypertensive lifestyle modification were recommended and discussed:  1. Limiting alcohol intake to less than 1 oz/day of ethanol:(24 oz of beer or 8 oz of wine or 2 oz of 100-proof whiskey). 2. Take baby ASA 81 mg daily. 3. Importance of regular aerobic exercise and losing weight. 4. Reduce dietary saturated fat and cholesterol intake for overall cardiovascular health. 5. Maintaining adequate dietary potassium, calcium, and magnesium intake. 6. Regular monitoring of the blood pressure. 7. Reduce sodium intake to less than 100 mmol/day (less than 2.3 gm of sodium or less than 6 gm of sodium choride)   This patient was seen by Vincent Gros FNP Collaboration with Dr Lyndon Code as a part of collaborative care agreement  Orders Placed This Encounter  Procedures  . Microscopic Examination  . Urine Culture, Reflex  . Flu Vaccine MDCK QUAD PF  . UA/M w/rflx Culture, Routine     Time spent: 62 Minutes      Lyndon Code, MD  Internal Medicine

## 2019-06-04 DIAGNOSIS — R300 Dysuria: Secondary | ICD-10-CM | POA: Insufficient documentation

## 2019-06-04 DIAGNOSIS — Z0001 Encounter for general adult medical examination with abnormal findings: Secondary | ICD-10-CM | POA: Insufficient documentation

## 2019-06-05 ENCOUNTER — Other Ambulatory Visit: Payer: Self-pay | Admitting: Nurse Practitioner

## 2019-06-06 LAB — LIPID PANEL W/O CHOL/HDL RATIO
Cholesterol, Total: 155 mg/dL (ref 100–199)
HDL: 45 mg/dL (ref >39)
LDL Chol Calc (NIH): 83 mg/dL (ref 0–99)
Triglycerides: 155 mg/dL — ABNORMAL HIGH (ref 0–149)
VLDL Cholesterol Cal: 27 mg/dL (ref 5–40)

## 2019-06-06 LAB — COMPREHENSIVE METABOLIC PANEL
ALT: 12 IU/L (ref 0–44)
AST: 19 IU/L (ref 0–40)
Albumin/Globulin Ratio: 1.8 (ref 1.2–2.2)
Albumin: 4.4 g/dL (ref 3.6–4.6)
Alkaline Phosphatase: 103 IU/L (ref 39–117)
BUN/Creatinine Ratio: 14 (ref 10–24)
BUN: 17 mg/dL (ref 8–27)
Bilirubin Total: 1.4 mg/dL — ABNORMAL HIGH (ref 0.0–1.2)
CO2: 26 mmol/L (ref 20–29)
Calcium: 9.3 mg/dL (ref 8.6–10.2)
Chloride: 103 mmol/L (ref 96–106)
Creatinine, Ser: 1.19 mg/dL (ref 0.76–1.27)
GFR calc Af Amer: 63 mL/min/{1.73_m2} (ref >59)
GFR calc non Af Amer: 55 mL/min/{1.73_m2} — ABNORMAL LOW (ref >59)
Globulin, Total: 2.5 g/dL (ref 1.5–4.5)
Glucose: 101 mg/dL — ABNORMAL HIGH (ref 65–99)
Potassium: 4.4 mmol/L (ref 3.5–5.2)
Sodium: 141 mmol/L (ref 134–144)
Total Protein: 6.9 g/dL (ref 6.0–8.5)

## 2019-06-06 LAB — CBC
Hematocrit: 46.9 % (ref 37.5–51.0)
Hemoglobin: 15.9 g/dL (ref 13.0–17.7)
MCH: 29.3 pg (ref 26.6–33.0)
MCHC: 33.9 g/dL (ref 31.5–35.7)
MCV: 87 fL (ref 79–97)
Platelets: 188 10*3/uL (ref 150–450)
RBC: 5.42 x10E6/uL (ref 4.14–5.80)
RDW: 13.1 % (ref 11.6–15.4)
WBC: 6.7 10*3/uL (ref 3.4–10.8)

## 2019-06-06 LAB — PSA: Prostate Specific Ag, Serum: 3.1 ng/mL (ref 0.0–4.0)

## 2019-06-06 LAB — TSH: TSH: 4.08 u[IU]/mL (ref 0.450–4.500)

## 2019-06-06 LAB — T4, FREE: Free T4: 1.13 ng/dL (ref 0.82–1.77)

## 2019-06-07 ENCOUNTER — Telehealth: Payer: Self-pay

## 2019-06-07 ENCOUNTER — Other Ambulatory Visit: Payer: Self-pay | Admitting: Nurse Practitioner

## 2019-06-07 DIAGNOSIS — N390 Urinary tract infection, site not specified: Secondary | ICD-10-CM

## 2019-06-07 MED ORDER — SULFAMETHOXAZOLE-TRIMETHOPRIM 800-160 MG PO TABS: 1.0000 | ORAL_TABLET | Freq: Two times a day (BID) | ORAL | 0 refills | Status: DC

## 2019-06-07 NOTE — Progress Notes (Signed)
Please let patient know that Patient had evidence of UTI at time of CPE on Friday. I have added rx for bactrim DS bid for 10 days and sent to his pharmacy. Thanks

## 2019-06-07 NOTE — Progress Notes (Signed)
Patient had evidence of UTI at time of CPE on Friday. I have added rx for bactrim DS bid for 10 days and sent to his pharmacy.

## 2019-06-07 NOTE — Telephone Encounter (Signed)
Per Nira Conn let patient know that Patient had evidence of UTI at time of CPE on Friday and added rx for bactrim DS bid for 10 days and sent to his pharmacy.

## 2019-06-08 LAB — URINE CULTURE, REFLEX

## 2019-06-08 LAB — UA/M W/RFLX CULTURE, ROUTINE
Bilirubin, UA: NEGATIVE
Glucose, UA: NEGATIVE
Ketones, UA: NEGATIVE
Nitrite, UA: NEGATIVE
RBC, UA: NEGATIVE
Specific Gravity, UA: 1.021 (ref 1.005–1.030)
Urobilinogen, Ur: 1 mg/dL (ref 0.2–1.0)
pH, UA: 5 (ref 5.0–7.5)

## 2019-06-08 LAB — MICROSCOPIC EXAMINATION
Casts: NONE SEEN /lpf
WBC, UA: >30 /hpf — AB (ref 0–5)

## 2019-06-22 ENCOUNTER — Encounter: Payer: Self-pay | Admitting: Nurse Practitioner

## 2019-06-22 ENCOUNTER — Ambulatory Visit: Payer: Medicare Other | Admitting: Nurse Practitioner

## 2019-06-22 ENCOUNTER — Other Ambulatory Visit: Payer: Self-pay

## 2019-06-22 VITALS — BP 128/80 | HR 82 | Temp 97.0°F | Resp 16 | Ht 71.0 in | Wt 189.0 lb

## 2019-06-22 DIAGNOSIS — I10 Essential (primary) hypertension: Secondary | ICD-10-CM

## 2019-06-22 NOTE — Progress Notes (Signed)
[facility] [address] [place], [place] [address]  Internal MEDICINE  Office Visit Note  Patient Name: Danny [name]  [id number]  [id number]  Date of Service: 06/22/2019  Chief Complaint  Patient presents with  . Hypertension  . Quality Metric Gaps    pna vacc    The patient is here for follow up of blood pressure. Was elevated at his last visit. Continues to be elevated at today's visit. He has been monitoring his blood pressure at home. His pressures are generally around 120 to 130 systolic and around 70 diastolic. A second check of blood pressure today shows blood pressure at 128/80. He continues to mow his own lawn as well as the lawn of his neighbor. He has no chest pain, chest pressure, or shortness of breath. He denies headache. He has no new concerns or complains. Lab work was done since his last visit. All results were normal.       Current Medication: Outpatient Encounter Medications as of 06/22/2019  Medication Sig  . esomeprazole (NEXIUM) 40 MG capsule TAKE 1 CAPSULE BY MOUTH EVERY DAY  . pravastatin (PRAVACHOL) 40 MG tablet TAKE 1 TABLET BY MOUTH EVERYDAY AT BEDTIME  . sulfamethoxazole-trimethoprim (BACTRIM DS) 800-160 MG tablet Take 1 tablet by mouth 2 (two) times daily.   No facility-administered encounter medications on file as of 06/22/2019.     Surgical History: Past Surgical History:  Procedure Laterality Date  . CHOLECYSTECTOMY  2003  . 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 REPLACEMENT TOTAL KNEE Right   . TOTAL KNEE ARTHROPLASTY Left 04/27/2016   Procedure: LEFT TOTAL KNEE ARTHROPLASTY;  Surgeon: Salvatore Marvel, MD;  Location: [facility] OR;  Service: Orthopedics;  Laterality: Left;  . TOTAL KNEE ARTHROPLASTY Right 04/04/2018   Procedure: TOTAL KNEE ARTHROPLASTY;  Surgeon: Salvatore Marvel, MD;  Location: [facility] OR;  Service: Orthopedics;  Laterality: Right;    Medical History: Past Medical History:  Diagnosis Date  . Essential  hypertension    a. Dr. Welton Flakes in [place] managing.  Previously on antihypertensive, which was later d/c'd.  Marland Kitchen GERD (gastroesophageal reflux disease)   . Hyperlipidemia   . Primary localized osteoarthritis of left knee    patient had left total knee 04/27/16  . Primary localized osteoarthritis of right knee   . PUD (peptic ulcer disease)    a. Remote h/o coffee grounds emesis followed by GI eval.  On Nexium since.  . S/P total knee replacement, left     Family History: Family History  Problem Relation Age of Onset  . Parkinson's disease Mother        died @ 19  . Pneumonia Father        died in his 74's  . Heart disease Sister        1/2 sister - currently 7.  Marland Kitchen Hypertension Sister   . Stroke Sister     Social History   Socioeconomic History  . Marital status: Married    Spouse name: Not on file  . Number of children: Not on file  . Years of education: Not on file  . Highest education level: Not on file  Occupational History    Comment: Previously worked in [employer] in [employer]  Social Needs  . Financial resource strain: Not on file  . Food insecurity    Worry: Not on file    Inability: Not on file  . Transportation needs    Medical: Not  on file    Non-medical: Not on file  Tobacco Use  . Smoking status: Never Smoker  . Smokeless tobacco: Never Used  Substance and Sexual Activity  . Alcohol use: No    Frequency: Never  . Drug use: No  . Sexual activity: Not on file  Lifestyle  . Physical activity    Days per week: Not on file    Minutes per session: Not on file  . Stress: Not on file  Relationships  . Social Herbalist on phone: Not on file    Gets together: Not on file    Attends religious service: Not on file    Active member of club or organization: Not on file    Attends meetings of clubs or organizations: Not on file    Relationship status: Not on file  . Intimate partner violence    Fear of current or ex partner: Not on file     Emotionally abused: Not on file    Physically abused: Not on file    Forced sexual activity: Not on file  Other Topics Concern  . Not on file  Social History Narrative   Married takes care of wife who has Parkinson's and Early Alzheimers.  Fairly active.      Review of Systems  Constitutional: Negative for activity change, fatigue and unexpected weight change.  HENT: Negative for congestion, postnasal drip, rhinorrhea, sinus pressure, sinus pain and sore throat.   Respiratory: Negative for cough and wheezing.   Cardiovascular: Negative for chest pain and palpitations.       Improved blood pressure   Gastrointestinal: Negative for abdominal pain, constipation, diarrhea and nausea.  Endocrine: Negative for cold intolerance, heat intolerance, polydipsia and polyuria.  Genitourinary: Negative for flank pain and frequency.  Musculoskeletal: Positive for back pain.  Skin: Negative.   Allergic/Immunologic: Negative for environmental allergies.  Neurological: Negative for dizziness and headaches.  Hematological: Negative for adenopathy.  Psychiatric/Behavioral: Negative for dysphoric mood. The patient is not nervous/anxious.    Today's Vitals   06/22/19 0917  BP: 128/80  Pulse: 82  Resp: 16  Temp: (!) 97 F (36.1 C)  SpO2: 96%  Weight: 189 lb (85.7 kg)  Height: 5\' 11"  (1.803 m)   Body mass index is 26.36 kg/m.  Physical Exam Vitals signs and nursing note reviewed.  Constitutional:      Appearance: Normal appearance.  HENT:     Head: Normocephalic and atraumatic.     Right Ear: External ear normal.     Left Ear: External ear normal.     Nose: Nose normal.  Eyes:     Pupils: Pupils are equal, round, and reactive to light.  Neck:     Musculoskeletal: Normal range of motion and neck supple.     Vascular: No carotid bruit.  Cardiovascular:     Rate and Rhythm: Normal rate. Rhythm irregular.     Pulses:          Radial pulses are 2+ on the right side and 2+ on the left  side.       Dorsalis pedis pulses are 2+ on the right side and 2+ on the left side.     Heart sounds: Normal heart sounds.  Pulmonary:     Effort: Pulmonary effort is normal.     Breath sounds: Normal breath sounds.  Abdominal:     General: Abdomen is flat. Bowel sounds are normal.     Palpations: Abdomen is soft.  Tenderness: There is no abdominal tenderness.  Musculoskeletal:     Right lower leg: No edema.     Left lower leg: No edema.     Comments: Mild, intermittent lower back pain. Worse with exertion. No abnormalities or deformities noted today.  Skin:    General: Skin is warm and dry.  Neurological:     General: No focal deficit present.     Mental Status: He is alert and oriented to person, place, and time.     Gait: Gait is intact.     Deep Tendon Reflexes:     Reflex Scores:      Patellar reflexes are 2+ on the right side and 2+ on the left side. Psychiatric:        Mood and Affect: Mood normal.        Behavior: Behavior normal.        Thought Content: Thought content normal.        Judgment: Judgment normal.    Assessment/Plan: 1. Essential hypertension Today's blood pressure along with blood pressure log indicate good control of blood pressure without medication. Will continue to monitor.   General Counseling: Danny [name] verbalizes understanding of the findings of todays visit and agrees with plan of treatment. I have discussed any further diagnostic evaluation that may be needed or ordered today. We also reviewed his medications today. he has been encouraged to call the office with any questions or concerns that should arise related to todays visit.   Hypertension Counseling:   The following hypertensive lifestyle modification were recommended and discussed:  1. Limiting alcohol intake to less than 1 oz/day of ethanol:(24 oz of beer or 8 oz of wine or 2 oz of 100-proof whiskey). 2. Take baby ASA 81 mg daily. 3. Importance of regular aerobic exercise and losing  weight. 4. Reduce dietary saturated fat and cholesterol intake for overall cardiovascular health. 5. Maintaining adequate dietary potassium, calcium, and magnesium intake. 6. Regular monitoring of the blood pressure. 7. Reduce sodium intake to less than 100 mmol/day (less than 2.3 gm of sodium or less than 6 gm of sodium choride)   This patient was seen by Vincent GrosHeather Jhalil Silvera FNP Collaboration with Dr Lyndon CodeFozia M Khan as a part of collaborative care agreement   Time spent: 15 Minutes      Dr Lyndon CodeFozia M Khan Internal medicine

## 2019-11-20 ENCOUNTER — Other Ambulatory Visit: Payer: Self-pay

## 2019-11-20 MED ORDER — ESOMEPRAZOLE MAGNESIUM 40 MG PO CPDR: DELAYED_RELEASE_CAPSULE | ORAL | 3 refills | Status: DC

## 2019-12-19 ENCOUNTER — Telehealth: Payer: Self-pay

## 2019-12-19 NOTE — Telephone Encounter (Signed)
Confirmed and screened for 12-21-19 ov. 

## 2019-12-21 ENCOUNTER — Encounter: Payer: Self-pay | Admitting: Nurse Practitioner

## 2019-12-21 ENCOUNTER — Other Ambulatory Visit: Payer: Self-pay

## 2019-12-21 ENCOUNTER — Ambulatory Visit: Payer: Medicare Other | Admitting: Nurse Practitioner

## 2019-12-21 VITALS — BP 132/84 | HR 85 | Temp 97.4°F | Resp 16 | Ht 71.0 in | Wt 199.8 lb

## 2019-12-21 DIAGNOSIS — I10 Essential (primary) hypertension: Secondary | ICD-10-CM | POA: Diagnosis not present

## 2019-12-21 DIAGNOSIS — K219 Gastro-esophageal reflux disease without esophagitis: Secondary | ICD-10-CM | POA: Diagnosis not present

## 2019-12-21 DIAGNOSIS — E782 Mixed hyperlipidemia: Secondary | ICD-10-CM | POA: Diagnosis not present

## 2019-12-21 NOTE — Progress Notes (Signed)
[facility] [address] [place], [place] [address]  Internal MEDICINE  Office Visit Note  Patient Name: Danny [name]  [id number]  [id number]  Date of Service: 12/21/2019  Chief Complaint  Patient presents with  . Gastroesophageal Reflux  . Hyperlipidemia    The patient is here for routine follow up visit. His blood pressure is a little elevated upon arrival, but this is normal for him. Will generally come down during the visit. Second check of blood pressure is 132/84. He states that he is doing well. He has no concerns or complaints to discuss today.       Current Medication: Outpatient Encounter Medications as of 12/21/2019  Medication Sig  . esomeprazole (NEXIUM) 40 MG capsule TAKE 1 CAPSULE BY MOUTH EVERY DAY  . pravastatin (PRAVACHOL) 40 MG tablet TAKE 1 TABLET BY MOUTH EVERYDAY AT BEDTIME  . [DISCONTINUED] sulfamethoxazole-trimethoprim (BACTRIM DS) 800-160 MG tablet Take 1 tablet by mouth 2 (two) times daily. (Patient not taking: Reported on 12/21/2019)   No facility-administered encounter medications on file as of 12/21/2019.    Surgical History: Past Surgical History:  Procedure Laterality Date  . CHOLECYSTECTOMY  2003  . 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 REPLACEMENT TOTAL KNEE Right   . TOTAL KNEE ARTHROPLASTY Left 04/27/2016   Procedure: LEFT TOTAL KNEE ARTHROPLASTY;  Surgeon: Salvatore Marvel, MD;  Location: [facility] OR;  Service: Orthopedics;  Laterality: Left;  . TOTAL KNEE ARTHROPLASTY Right 04/04/2018   Procedure: TOTAL KNEE ARTHROPLASTY;  Surgeon: Salvatore Marvel, MD;  Location: [facility] OR;  Service: Orthopedics;  Laterality: Right;    Medical History: Past Medical History:  Diagnosis Date  . Essential hypertension    a. Dr. Welton Flakes in [place] managing.  Previously on antihypertensive, which was later d/c'd.  Marland Kitchen GERD (gastroesophageal reflux disease)   . Hyperlipidemia   . Primary localized osteoarthritis of left knee    patient had  left total knee 04/27/16  . Primary localized osteoarthritis of right knee   . PUD (peptic ulcer disease)    a. Remote h/o coffee grounds emesis followed by GI eval.  On Nexium since.  . S/P total knee replacement, left     Family History: Family History  Problem Relation Age of Onset  . Parkinson's disease Mother        died @ 43  . Pneumonia Father        died in his 77's  . Heart disease Sister        1/2 sister - currently 54.  Marland Kitchen Hypertension Sister   . Stroke Sister     Social History   Socioeconomic History  . Marital status: Married    Spouse name: Not on file  . Number of children: Not on file  . Years of education: Not on file  . Highest education level: Not on file  Occupational History    Comment: Previously worked in [employer] in [employer]  Tobacco Use  . Smoking status: Never Smoker  . Smokeless tobacco: Never Used  Substance and Sexual Activity  . Alcohol use: No  . Drug use: No  . Sexual activity: Not on file  Other Topics Concern  . Not on file  Social History Narrative   Married takes care of wife who has Parkinson's and Early Alzheimers.  Fairly active.   Social Determinants of Health   Financial Resource Strain:   . Difficulty of Paying Living Expenses:   Food Insecurity:   .  Worried About Charity fundraiser in the Last Year:   . Arboriculturist in the Last Year:   Transportation Needs:   . Film/video editor (Medical):   Marland Kitchen Lack of Transportation (Non-Medical):   Physical Activity:   . Days of Exercise per Week:   . Minutes of Exercise per Session:   Stress:   . Feeling of Stress :   Social Connections:   . Frequency of Communication with Friends and Family:   . Frequency of Social Gatherings with Friends and Family:   . Attends Religious Services:   . Active Member of Clubs or Organizations:   . Attends Archivist Meetings:   Marland Kitchen Marital Status:   Intimate Partner Violence:   . Fear of Current or Ex-Partner:   .  Emotionally Abused:   Marland Kitchen Physically Abused:   . Sexually Abused:       Review of Systems  Constitutional: Negative for activity change, fatigue and unexpected weight change.  HENT: Negative for congestion, postnasal drip, rhinorrhea, sinus pressure, sinus pain and sore throat.   Respiratory: Negative for cough and wheezing.   Cardiovascular: Negative for chest pain and palpitations.       Blood pressure well managed   Gastrointestinal: Negative for abdominal pain, constipation, diarrhea and nausea.  Endocrine: Negative for cold intolerance, heat intolerance, polydipsia and polyuria.  Musculoskeletal: Negative for back pain.  Skin: Negative for rash.  Allergic/Immunologic: Negative for environmental allergies.  Neurological: Negative for dizziness and headaches.  Hematological: Negative for adenopathy.  Psychiatric/Behavioral: Negative for dysphoric mood. The patient is not nervous/anxious.     Today's Vitals   12/21/19 0948  BP: 132/84  Pulse: 85  Resp: 16  Temp: (!) 97.4 F (36.3 C)  SpO2: 96%  Weight: 199 lb 12.8 oz (90.6 kg)  Height: 5\' 11"  (1.803 m)   Body mass index is 27.87 kg/m.  Physical Exam Vitals and nursing note reviewed.  Constitutional:      Appearance: Normal appearance.  HENT:     Head: Normocephalic and atraumatic.     Right Ear: External ear normal.     Left Ear: External ear normal.     Nose: Nose normal.  Eyes:     Pupils: Pupils are equal, round, and reactive to light.  Neck:     Vascular: No carotid bruit.  Cardiovascular:     Rate and Rhythm: Normal rate. Rhythm irregular.     Pulses:          Radial pulses are 2+ on the right side and 2+ on the left side.       Dorsalis pedis pulses are 2+ on the right side and 2+ on the left side.     Heart sounds: Normal heart sounds.  Pulmonary:     Effort: Pulmonary effort is normal.     Breath sounds: Normal breath sounds.  Abdominal:     General: Abdomen is flat.     Palpations: Abdomen is  soft.  Musculoskeletal:     Cervical back: Normal range of motion and neck supple.     Right lower leg: No edema.     Left lower leg: No edema.  Skin:    General: Skin is warm and dry.  Neurological:     General: No focal deficit present.     Mental Status: He is alert and oriented to person, place, and time.     Gait: Gait is intact.     Deep Tendon Reflexes:  Reflex Scores:      Patellar reflexes are 2+ on the right side and 2+ on the left side. Psychiatric:        Mood and Affect: Mood normal.        Behavior: Behavior normal.        Thought Content: Thought content normal.        Judgment: Judgment normal.   Assessment/Plan: 1. Essential hypertension Stable and managed without medications.  2. Mixed hyperlipidemia Stable. Continue prvavastatin as prescribed   3. Gastroesophageal reflux disease without esophagitis Continue nexium as prescribed   General Counseling: Chozen verbalizes understanding of the findings of todays visit and agrees with plan of treatment. I have discussed any further diagnostic evaluation that may be needed or ordered today. We also reviewed his medications today. he has been encouraged to call the office with any questions or concerns that should arise related to todays visit.  This patient was seen by Vincent Gros FNP Collaboration with Dr Lyndon Code as a part of collaborative care agreement   Total time spent: 20 Minutes   Time spent includes review of chart, medications, test results, and follow up plan with the patient.      Dr Lyndon Code Internal medicine

## 2020-02-19 ENCOUNTER — Other Ambulatory Visit: Payer: Self-pay

## 2020-02-19 MED ORDER — ESOMEPRAZOLE MAGNESIUM 40 MG PO CPDR: DELAYED_RELEASE_CAPSULE | ORAL | 3 refills | Status: DC

## 2020-05-21 ENCOUNTER — Other Ambulatory Visit: Payer: Self-pay

## 2020-05-21 MED ORDER — ESOMEPRAZOLE MAGNESIUM 40 MG PO CPDR: DELAYED_RELEASE_CAPSULE | ORAL | 3 refills | Status: DC

## 2020-06-03 ENCOUNTER — Other Ambulatory Visit: Payer: Self-pay

## 2020-06-03 ENCOUNTER — Ambulatory Visit (INDEPENDENT_AMBULATORY_CARE_PROVIDER_SITE_OTHER): Payer: Medicare Other | Admitting: Nurse Practitioner

## 2020-06-03 ENCOUNTER — Encounter: Payer: Self-pay | Admitting: Nurse Practitioner

## 2020-06-03 VITALS — BP 162/95 | HR 93 | Temp 97.5°F | Ht 71.0 in | Wt 197.8 lb

## 2020-06-03 DIAGNOSIS — Z23 Encounter for immunization: Secondary | ICD-10-CM | POA: Diagnosis not present

## 2020-06-03 DIAGNOSIS — R300 Dysuria: Secondary | ICD-10-CM

## 2020-06-03 DIAGNOSIS — E782 Mixed hyperlipidemia: Secondary | ICD-10-CM

## 2020-06-03 DIAGNOSIS — I10 Essential (primary) hypertension: Secondary | ICD-10-CM | POA: Diagnosis not present

## 2020-06-03 DIAGNOSIS — Z0001 Encounter for general adult medical examination with abnormal findings: Secondary | ICD-10-CM

## 2020-06-03 DIAGNOSIS — R011 Cardiac murmur, unspecified: Secondary | ICD-10-CM | POA: Diagnosis not present

## 2020-06-03 MED ORDER — METOPROLOL SUCCINATE ER 25 MG PO TB24: 25.0000 mg | ORAL_TABLET | Freq: Every day | ORAL | 3 refills | Status: DC

## 2020-06-03 NOTE — Progress Notes (Signed)
[facility] [address] [place], [place] [address]  Internal MEDICINE  Office Visit Note  Patient Name: Danny [name]  [id number]  [id number]  Date of Service: 06/03/2020  Chief Complaint  Patient presents with  . Medicare Wellness  . Quality Metric Gaps    tetnaus     The patient is here for health maintenance exam. Blood pressure is elevated today. Is generally elevated when he first arrives in the office, but will come down during his visit. Today, blood pressure remained elevated. He denies chest pain, chest pressure, shortness of breath, or headaches. He did, recently, have cataracts from both of his eyes. Has follow up tomorrow with eye doctor. He is due to have routine, fasting labs. He would also like to have flu shot while he is here.   Pt is here for routine health maintenance examination  Current Medication: Outpatient Encounter Medications as of 06/03/2020  Medication Sig  . esomeprazole (NEXIUM) 40 MG capsule TAKE 1 CAPSULE BY MOUTH EVERY DAY  . pravastatin (PRAVACHOL) 40 MG tablet TAKE 1 TABLET BY MOUTH EVERYDAY AT BEDTIME  . metoprolol succinate (TOPROL-XL) 25 MG 24 hr tablet Take 1 tablet (25 mg total) by mouth daily.   No facility-administered encounter medications on file as of 06/03/2020.    Surgical History: Past Surgical History:  Procedure Laterality Date  . CATARACT EXTRACTION, BILATERAL    . CHOLECYSTECTOMY  2003  . 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 REPLACEMENT TOTAL KNEE Right   . TOTAL KNEE ARTHROPLASTY Left 04/27/2016   Procedure: LEFT TOTAL KNEE ARTHROPLASTY;  Surgeon: Salvatore Marvel, MD;  Location: [facility] OR;  Service: Orthopedics;  Laterality: Left;  . TOTAL KNEE ARTHROPLASTY Right 04/04/2018   Procedure: TOTAL KNEE ARTHROPLASTY;  Surgeon: Salvatore Marvel, MD;  Location: [facility] OR;  Service: Orthopedics;  Laterality: Right;    Medical History: Past Medical History:  Diagnosis Date  . Essential hypertension    a.  Dr. Welton Flakes in [place] managing.  Previously on antihypertensive, which was later d/c'd.  [place] GERD (gastroesophageal reflux disease)   . Hyperlipidemia   . Primary localized osteoarthritis of left knee    patient had left total knee 04/27/16  . Primary localized osteoarthritis of right knee   . PUD (peptic ulcer disease)    a. Remote h/o coffee grounds emesis followed by GI eval.  On Nexium since.  . S/P total knee replacement, left     Family History: Family History  Problem Relation Age of Onset  . Parkinson's disease Mother        died @ 2  . Pneumonia Father        died in his 54's  . Heart disease Sister        1/2 sister - currently 9.  [place] [employer] Hypertension Sister   . Stroke Sister       Review of Systems  Constitutional: Negative for activity change, chills, fatigue and unexpected weight change.  HENT: Negative for congestion, postnasal drip, rhinorrhea, sneezing and sore throat.   Respiratory: Negative for cough, chest tightness, shortness of breath and wheezing.   Cardiovascular: Negative for chest pain and palpitations.       Elevated blood pressure today.  Gastrointestinal: Negative for abdominal pain, constipation, diarrhea, nausea and vomiting.  Endocrine: Negative for cold intolerance, heat intolerance, polydipsia and polyuria.  Genitourinary: Negative for dysuria, frequency and urgency.  Musculoskeletal: Negative for arthralgias, back pain, joint swelling and neck pain.  Skin: Negative for rash.  Allergic/Immunologic: Negative for environmental allergies.  Neurological: Negative for dizziness, tremors, numbness and headaches.  Hematological: Negative for adenopathy. Does not bruise/bleed easily.  Psychiatric/Behavioral: Negative for behavioral problems (Depression), sleep disturbance and suicidal ideas. The patient is not nervous/anxious.     Today's Vitals   06/03/20 0951  BP: (!) 162/95  Pulse: 93  Temp: (!) 97.5 F (36.4 C)  SpO2: 93%  Weight: 197 lb 12.8  oz (89.7 kg)  Height: 5\' 11"  (1.803 m)   Body mass index is 27.59 kg/m.  Physical Exam Vitals and nursing note reviewed.  Constitutional:      General: He is not in acute distress.    Appearance: Normal appearance. He is well-developed. He is not diaphoretic.  HENT:     Head: Normocephalic and atraumatic.     Nose: Nose normal.     Mouth/Throat:     Pharynx: No oropharyngeal exudate.  Eyes:     Pupils: Pupils are equal, round, and reactive to light.  Neck:     Thyroid: No thyromegaly.     Vascular: No carotid bruit or JVD.     Trachea: No tracheal deviation.  Cardiovascular:     Rate and Rhythm: Normal rate. Rhythm irregular.     Pulses: Normal pulses.     Heart sounds: Murmur heard.  No friction rub. No gallop.   Pulmonary:     Effort: Pulmonary effort is normal. No respiratory distress.     Breath sounds: Normal breath sounds. No wheezing or rales.  Chest:     Chest wall: No tenderness.  Abdominal:     General: Bowel sounds are normal.     Palpations: Abdomen is soft.     Tenderness: There is no abdominal tenderness.  Musculoskeletal:        General: Normal range of motion.     Cervical back: Normal range of motion and neck supple.  Lymphadenopathy:     Cervical: No cervical adenopathy.  Skin:    General: Skin is warm and dry.  Neurological:     General: No focal deficit present.     Mental Status: He is alert and oriented to person, place, and time.     Cranial Nerves: No cranial nerve deficit.  Psychiatric:        Mood and Affect: Mood normal.        Behavior: Behavior normal.        Thought Content: Thought content normal.        Judgment: Judgment normal.    Depression screen [facility] 2/9 06/03/2020 12/21/2019 06/02/2019 09/22/2018 05/30/2018  Decreased Interest 0 0 0 0 0  Down, Depressed, Hopeless 0 0 0 0 0  PHQ - 2 Score 0 0 0 0 0    Functional Status Survey: Is the patient deaf or have difficulty hearing?: Yes Does the patient have difficulty seeing, even  when wearing glasses/contacts?: No Does the patient have difficulty concentrating, remembering, or making decisions?: No Does the patient have difficulty walking or climbing stairs?: No Does the patient have difficulty dressing or bathing?: No Does the patient have difficulty doing errands alone such as visiting a doctor's office or shopping?: No  MMSE - Mini Mental State Exam 06/03/2020 06/02/2019  Orientation to time 5 5  Orientation to Place 5 5  Registration 3 3  Attention/ Calculation 5 5  Recall 3 3  Language- name 2 objects 2 2  Language- repeat 1 1  Language- follow 3 step command 3  3  Language- read & follow direction 1 1  Write a sentence 1 1  Copy design 1 1  Total score 30 30    Fall Risk  06/03/2020 12/21/2019 06/02/2019 09/22/2018 05/30/2018  Falls in the past year? 0 0 0 0 No      Assessment/Plan: 1. Encounter for general adult medical examination with abnormal findings Annual health maintenance exam today. Order slip given for patient to have routine, fasting labs done.   2. Essential hypertension Start metoprolol ER 25mg . Suggested he take this in the evenings. Limit intake of salt and increase water in the diet. Monitor blood pressure at home.  - metoprolol succinate (TOPROL-XL) 25 MG 24 hr tablet; Take 1 tablet (25 mg total) by mouth daily.  Dispense: 90 tablet; Refill: 3  3. Systolic murmur Not detected prior to this visit. Will get echocardiogram for urther evaluation.  - ECHOCARDIOGRAM COMPLETE; Future  4. Mixed hyperlipidemia Check fasting lipid panel and adjust pravachol as indicated.   5. Needs flu shot Flu vaccine administered today.  - Flu Vaccine MDCK QUAD PF  6. Dysuria - UA/M w/rflx Culture, Routine   General Counseling: Remi verbalizes understanding of the findings of todays visit and agrees with plan of treatment. I have discussed any further diagnostic evaluation that may be needed or ordered today. We also reviewed his medications today.  he has been encouraged to call the office with any questions or concerns that should arise related to todays visit.    Counseling:  Hypertension Counseling:   The following hypertensive lifestyle modification were recommended and discussed:  1. Limiting alcohol intake to less than 1 oz/day of ethanol:(24 oz of beer or 8 oz of wine or 2 oz of 100-proof whiskey). 2. Take baby ASA 81 mg daily. 3. Importance of regular aerobic exercise and losing weight. 4. Reduce dietary saturated fat and cholesterol intake for overall cardiovascular health. 5. Maintaining adequate dietary potassium, calcium, and magnesium intake. 6. Regular monitoring of the blood pressure. 7. Reduce sodium intake to less than 100 mmol/day (less than 2.3 gm of sodium or less than 6 gm of sodium choride)   This patient was seen by FNP Collaboration with Dr Vincent Gros as a part of collaborative care agreement  Orders Placed This Encounter  Procedures  . Flu Vaccine MDCK QUAD PF  . UA/M w/rflx Culture, Routine  . ECHOCARDIOGRAM COMPLETE    Meds ordered this encounter  Medications  . metoprolol succinate (TOPROL-XL) 25 MG 24 hr tablet    Sig: Take 1 tablet (25 mg total) by mouth daily.    Dispense:  90 tablet    Refill:  3    Order Specific Question:   Supervising Provider    Answer:   Lyndon Code [[id number]]    Total time spent: 45 Minutes  Time spent includes review of chart, medications, test results, and follow up plan with the patient.     Lyndon Code, MD  Internal Medicine

## 2020-06-05 ENCOUNTER — Other Ambulatory Visit: Payer: Self-pay | Admitting: Nurse Practitioner

## 2020-06-06 LAB — CBC
Hematocrit: 49.2 % (ref 37.5–51.0)
Hemoglobin: 16.6 g/dL (ref 13.0–17.7)
MCH: 29.5 pg (ref 26.6–33.0)
MCHC: 33.7 g/dL (ref 31.5–35.7)
MCV: 88 fL (ref 79–97)
Platelets: 207 10*3/uL (ref 150–450)
RBC: 5.62 x10E6/uL (ref 4.14–5.80)
RDW: 13.6 % (ref 11.6–15.4)
WBC: 8.7 10*3/uL (ref 3.4–10.8)

## 2020-06-06 LAB — COMPREHENSIVE METABOLIC PANEL
ALT: 12 IU/L (ref 0–44)
AST: 14 IU/L (ref 0–40)
Albumin/Globulin Ratio: 1.8 (ref 1.2–2.2)
Albumin: 4.6 g/dL (ref 3.6–4.6)
Alkaline Phosphatase: 110 IU/L (ref 44–121)
BUN/Creatinine Ratio: 14 (ref 10–24)
BUN: 18 mg/dL (ref 8–27)
Bilirubin Total: 1.1 mg/dL (ref 0.0–1.2)
CO2: 24 mmol/L (ref 20–29)
Calcium: 9.7 mg/dL (ref 8.6–10.2)
Chloride: 98 mmol/L (ref 96–106)
Creatinine, Ser: 1.26 mg/dL (ref 0.76–1.27)
GFR calc Af Amer: 58 mL/min/{1.73_m2} — ABNORMAL LOW (ref >59)
GFR calc non Af Amer: 51 mL/min/{1.73_m2} — ABNORMAL LOW (ref >59)
Globulin, Total: 2.5 g/dL (ref 1.5–4.5)
Glucose: 94 mg/dL (ref 65–99)
Potassium: 5 mmol/L (ref 3.5–5.2)
Sodium: 137 mmol/L (ref 134–144)
Total Protein: 7.1 g/dL (ref 6.0–8.5)

## 2020-06-06 LAB — LIPID PANEL WITH LDL/HDL RATIO
Cholesterol, Total: 170 mg/dL (ref 100–199)
HDL: 46 mg/dL (ref >39)
LDL Chol Calc (NIH): 101 mg/dL — ABNORMAL HIGH (ref 0–99)
LDL/HDL Ratio: 2.2 ratio (ref 0.0–3.6)
Triglycerides: 130 mg/dL (ref 0–149)
VLDL Cholesterol Cal: 23 mg/dL (ref 5–40)

## 2020-06-06 LAB — T4, FREE: Free T4: 0.96 ng/dL (ref 0.82–1.77)

## 2020-06-06 LAB — PSA: Prostate Specific Ag, Serum: 4.5 ng/mL — ABNORMAL HIGH (ref 0.0–4.0)

## 2020-06-06 LAB — TSH: TSH: 4.31 u[IU]/mL (ref 0.450–4.500)

## 2020-06-06 NOTE — Progress Notes (Signed)
Waiting on results of urine culture and sensitivity.

## 2020-06-07 LAB — UA/M W/RFLX CULTURE, ROUTINE
Bilirubin, UA: NEGATIVE
Glucose, UA: NEGATIVE
Ketones, UA: NEGATIVE
Nitrite, UA: NEGATIVE
Protein,UA: NEGATIVE
RBC, UA: NEGATIVE
Specific Gravity, UA: 1.014 (ref 1.005–1.030)
Urobilinogen, Ur: 1 mg/dL (ref 0.2–1.0)
pH, UA: 6.5 (ref 5.0–7.5)

## 2020-06-07 LAB — MICROSCOPIC EXAMINATION
Bacteria, UA: NONE SEEN
Casts: NONE SEEN /lpf

## 2020-06-07 LAB — URINE CULTURE, REFLEX

## 2020-06-12 ENCOUNTER — Other Ambulatory Visit: Payer: Self-pay | Admitting: Nurse Practitioner

## 2020-06-12 DIAGNOSIS — N390 Urinary tract infection, site not specified: Secondary | ICD-10-CM

## 2020-06-12 MED ORDER — CIPROFLOXACIN HCL 250 MG PO TABS: 250.0000 mg | ORAL_TABLET | Freq: Two times a day (BID) | ORAL | 0 refills | Status: DC

## 2020-06-12 NOTE — Progress Notes (Signed)
uti at time of CPE. Added cipro 250mg  twice daily for next 7 days and sent to pharmacy.

## 2020-06-12 NOTE — Progress Notes (Signed)
Elevated PSA. Will refer to urology. Discuss at visit 06/24/2020

## 2020-06-12 NOTE — Progress Notes (Signed)
Please let the patient know that he had uti at time of CPE. Added cipro 250mg  twice daily for next 7 days and sent to pharmacy.  thanks

## 2020-06-21 ENCOUNTER — Ambulatory Visit: Payer: Medicare Other

## 2020-06-21 ENCOUNTER — Other Ambulatory Visit: Payer: Self-pay

## 2020-06-21 DIAGNOSIS — R011 Cardiac murmur, unspecified: Secondary | ICD-10-CM

## 2020-06-24 ENCOUNTER — Other Ambulatory Visit: Payer: Self-pay

## 2020-06-24 ENCOUNTER — Ambulatory Visit: Payer: Medicare Other | Admitting: Nurse Practitioner

## 2020-06-24 ENCOUNTER — Encounter: Payer: Self-pay | Admitting: Nurse Practitioner

## 2020-06-24 VITALS — BP 122/70 | HR 74 | Temp 97.5°F | Resp 16 | Ht 71.0 in | Wt 199.0 lb

## 2020-06-24 DIAGNOSIS — I10 Essential (primary) hypertension: Secondary | ICD-10-CM

## 2020-06-24 DIAGNOSIS — R011 Cardiac murmur, unspecified: Secondary | ICD-10-CM

## 2020-06-24 DIAGNOSIS — I77819 Aortic ectasia, unspecified site: Secondary | ICD-10-CM | POA: Diagnosis not present

## 2020-06-24 NOTE — Progress Notes (Signed)
[facility] [address] [place], [place] [address]  Internal MEDICINE  Office Visit Note  Patient Name: Danny [name]  [id number]  [id number]  Date of Service: 06/24/2020  Chief Complaint  Patient presents with  . Follow-up    [place] follow up  . controlled substance form    reviewed with PT  . Hyperlipidemia  . Hypertension    The patient is here for follow up visit. Was started on metoprolol XR 25mg  daily at his most recent visit. His blood pressure is improved and he has tolerated this addition well. He does have moderate, blowing systolic murmur. He had echocardiogram since his last visit. Echo shows normal LVEF at 60-65%. There is aortic valve sclerosis with mild aortic stenosis and aortic regurgitation. There is mild to moderate tricuspid regurgitation with borderline PAH. The ascending aorta is mildly dilated. The patient denies chest pain, chest pressure, or shortness of breath.       Current Medication: Outpatient Encounter Medications as of 06/24/2020  Medication Sig  . esomeprazole (NEXIUM) 40 MG capsule TAKE 1 CAPSULE BY MOUTH EVERY DAY  . metoprolol succinate (TOPROL-XL) 25 MG 24 hr tablet Take 1 tablet (25 mg total) by mouth daily.  . pravastatin (PRAVACHOL) 40 MG tablet TAKE 1 TABLET BY MOUTH EVERYDAY AT BEDTIME  . [DISCONTINUED] ciprofloxacin (CIPRO) 250 MG tablet Take 1 tablet (250 mg total) by mouth 2 (two) times daily.   No facility-administered encounter medications on file as of 06/24/2020.    Surgical History: Past Surgical History:  Procedure Laterality Date  . CATARACT EXTRACTION, BILATERAL    . CHOLECYSTECTOMY  2003  . 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 REPLACEMENT TOTAL KNEE Right   . TOTAL KNEE ARTHROPLASTY Left 04/27/2016   Procedure: LEFT TOTAL KNEE ARTHROPLASTY;  Surgeon: 04/29/2016, MD;  Location: [facility] OR;  Service: Orthopedics;  Laterality: Left;  . TOTAL KNEE ARTHROPLASTY Right 04/04/2018   Procedure: TOTAL  KNEE ARTHROPLASTY;  Surgeon: 04/06/2018, MD;  Location: [facility] OR;  Service: Orthopedics;  Laterality: Right;    Medical History: Past Medical History:  Diagnosis Date  . Essential hypertension    a. [facility]. [facility] in [place] managing.  Previously on antihypertensive, which was later d/c'd.  [place] GERD (gastroesophageal reflux disease)   . Hyperlipidemia   . Primary localized osteoarthritis of left knee    patient had left total knee 04/27/16  . Primary localized osteoarthritis of right knee   . PUD (peptic ulcer disease)    a. Remote h/o coffee grounds emesis followed by GI eval.  On Nexium since.  . S/P total knee replacement, left     Family History: Family History  Problem Relation Age of Onset  . Parkinson's disease Mother        died @ 54  . Pneumonia Father        died in his 36's  . Heart disease Sister        1/2 sister - currently 74.  77 Hypertension Sister   . Stroke Sister     Social History   Socioeconomic History  . Marital status: Married    Spouse name: Not on file  . Number of children: Not on file  . Years of education: Not on file  . Highest education level: Not on file  Occupational History    Comment: Previously worked in [employer] in [employer].  Tobacco Use  . Smoking status: Never Smoker  .  Smokeless tobacco: Never Used  Vaping Use  . Vaping Use: Never used  Substance and Sexual Activity  . Alcohol use: No  . Drug use: No  . Sexual activity: Not on file  Other Topics Concern  . Not on file  Social History Narrative   Married takes care of wife who has Parkinson's and Early Alzheimers.  Fairly active.   Social Determinants of Health   Financial Resource Strain:   . Difficulty of Paying Living Expenses: Not on file  Food Insecurity:   . Worried About Programme researcher, broadcasting/film/video in the Last Year: Not on file  . Ran Out of Food in the Last Year: Not on file  Transportation Needs:   . Lack of Transportation (Medical): Not on file  . Lack of  Transportation (Non-Medical): Not on file  Physical Activity:   . Days of Exercise per Week: Not on file  . Minutes of Exercise per Session: Not on file  Stress:   . Feeling of Stress : Not on file  Social Connections:   . Frequency of Communication with Friends and Family: Not on file  . Frequency of Social Gatherings with Friends and Family: Not on file  . Attends Religious Services: Not on file  . Active Member of Clubs or Organizations: Not on file  . Attends Banker Meetings: Not on file  . Marital Status: Not on file  Intimate Partner Violence:   . Fear of Current or Ex-Partner: Not on file  . Emotionally Abused: Not on file  . Physically Abused: Not on file  . Sexually Abused: Not on file      Review of Systems  Constitutional: Negative for activity change, chills, fatigue and unexpected weight change.  HENT: Negative for congestion, postnasal drip, rhinorrhea, sneezing and sore throat.   Respiratory: Negative for cough, chest tightness, shortness of breath and wheezing.   Cardiovascular: Negative for chest pain and palpitations.       Improved blood pressure today.   Gastrointestinal: Negative for abdominal pain, constipation, diarrhea, nausea and vomiting.  Endocrine: Negative for cold intolerance, heat intolerance, polydipsia and polyuria.  Musculoskeletal: Negative for arthralgias, back pain, joint swelling and neck pain.  Skin: Negative for rash.  Allergic/Immunologic: Negative for environmental allergies.  Neurological: Negative for dizziness, tremors, numbness and headaches.  Hematological: Negative for adenopathy. Does not bruise/bleed easily.  Psychiatric/Behavioral: Negative for behavioral problems (Depression), sleep disturbance and suicidal ideas. The patient is not nervous/anxious.     Today's Vitals   06/24/20 1144  BP: 122/70  Pulse: 74  Resp: 16  Temp: (!) 97.5 F (36.4 C)  SpO2: 99%  Weight: 199 lb (90.3 kg)  Height: 5\' 11"  (1.803 m)    Body mass index is 27.75 kg/m.  Physical Exam Vitals and nursing note reviewed.  Constitutional:      General: He is not in acute distress.    Appearance: Normal appearance. He is well-developed. He is not diaphoretic.  HENT:     Head: Normocephalic and atraumatic.     Nose: Nose normal.     Mouth/Throat:     Pharynx: No oropharyngeal exudate.  Eyes:     Pupils: Pupils are equal, round, and reactive to light.  Neck:     Thyroid: No thyromegaly.     Vascular: No carotid bruit or JVD.     Trachea: No tracheal deviation.  Cardiovascular:     Rate and Rhythm: Normal rate. Rhythm irregular.     Pulses: Normal pulses.  Heart sounds: Murmur heard.  No friction rub. No gallop.   Pulmonary:     Effort: Pulmonary effort is normal. No respiratory distress.     Breath sounds: Normal breath sounds. No wheezing or rales.  Chest:     Chest wall: No tenderness.  Abdominal:     Palpations: Abdomen is soft.  Musculoskeletal:        General: Normal range of motion.     Cervical back: Normal range of motion and neck supple.  Lymphadenopathy:     Cervical: No cervical adenopathy.  Skin:    General: Skin is warm and dry.  Neurological:     General: No focal deficit present.     Mental Status: He is alert and oriented to person, place, and time.     Cranial Nerves: No cranial nerve deficit.  Psychiatric:        Mood and Affect: Mood normal.        Behavior: Behavior normal.        Thought Content: Thought content normal.        Judgment: Judgment normal.     Assessment/Plan: 1. Essential hypertension Improved. Continue bp medication as prescribed   2. Systolic murmur Reviewed echocardiogram with the patient. It shows normal LVEF at 60-65%. There is aortic valve sclerosis with mild aortic stenosis and aortic regurgitation. There is mild to moderate tricuspid regurgitation with borderline PAH. The ascending aorta is mildly dilated. Will get ultrasound of abdominal vasculature for  further evaluation.   3. Aortic ectasia (HCC) The ascending aorta is mildly dilated. Will get ultrasound of abdominal vasculature for further evaluation.  - VAS US AORTA/IVC/ILIACS; Future  General Counseling: Danny [name] verbalizes understanding of the findings of todays visit and agrees with plan of treatment. I have discussed any further diagnostic evaluation that may be needed or ordered today. We also reviewed his medications today. he has been encouraged to call the office with any questions or concerns that should arise related to todays visit.  This patient was seen by Vincent Gros FNP Collaboration with Dr Lyndon Code as a part of collaborative care agreement  Orders Placed This Encounter  Procedures  . VAS US AORTA/IVC/ILIACS      Total time spent: 30 Minutes   Time spent includes review of chart, medications, test results, and follow up plan with the patient.      Dr Lyndon Code Internal medicine

## 2020-07-17 ENCOUNTER — Other Ambulatory Visit: Payer: Self-pay

## 2020-07-17 MED ORDER — PRAVASTATIN SODIUM 40 MG PO TABS: ORAL_TABLET | ORAL | 3 refills | Status: DC

## 2020-07-19 ENCOUNTER — Ambulatory Visit: Payer: Medicare Other

## 2020-07-19 ENCOUNTER — Other Ambulatory Visit: Payer: Self-pay

## 2020-07-19 DIAGNOSIS — I77819 Aortic ectasia, unspecified site: Secondary | ICD-10-CM

## 2020-07-19 DIAGNOSIS — I77811 Abdominal aortic ectasia: Secondary | ICD-10-CM | POA: Diagnosis not present

## 2020-07-29 ENCOUNTER — Ambulatory Visit: Payer: Medicare Other | Admitting: Nurse Practitioner

## 2020-07-29 ENCOUNTER — Other Ambulatory Visit: Payer: Self-pay

## 2020-07-29 ENCOUNTER — Encounter: Payer: Self-pay | Admitting: Nurse Practitioner

## 2020-07-29 VITALS — BP 132/80 | HR 66 | Temp 98.2°F | Resp 16 | Ht 71.0 in | Wt 204.6 lb

## 2020-07-29 DIAGNOSIS — I77819 Aortic ectasia, unspecified site: Secondary | ICD-10-CM

## 2020-07-29 DIAGNOSIS — I10 Essential (primary) hypertension: Secondary | ICD-10-CM

## 2020-07-29 NOTE — Progress Notes (Signed)
[facility] [address] [place], [place] [address]  Internal MEDICINE  Office Visit Note  Patient Name: Danny [name]  [id number]  [id number]  Date of Service: 09/01/2020  Chief Complaint  Patient presents with  . Follow-up    [place] results  . Gastroesophageal Reflux  . Hyperlipidemia  . Hypertension  . controlled substance form    reviewed with PT    The patient is here for follow up visit. He had had  echocardiogram which showed normal LVEF at 60-65%. There is aortic valve sclerosis with mild aortic stenosis and aortic regurgitation. There is mild to moderate tricuspid regurgitation with borderline PAH. The ascending aorta is mildly dilated. Since the echo, patient has now had a screen for abdominal aortic aneurysm. This study was normal and he will continue to be monitored. The patient denies chest pain, chest pressure, or shortness of breath.       Current Medication: Outpatient Encounter Medications as of 07/29/2020  Medication Sig  . metoprolol succinate (TOPROL-XL) 25 MG 24 hr tablet Take 1 tablet (25 mg total) by mouth daily.  . pravastatin (PRAVACHOL) 40 MG tablet TAKE 1 TABLET BY MOUTH EVERYDAY AT BEDTIME  . [DISCONTINUED] esomeprazole (NEXIUM) 40 MG capsule TAKE 1 CAPSULE BY MOUTH EVERY DAY   No facility-administered encounter medications on file as of 07/29/2020.    Surgical History: Past Surgical History:  Procedure Laterality Date  . CATARACT EXTRACTION, BILATERAL    . CHOLECYSTECTOMY  2003  . 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 REPLACEMENT TOTAL KNEE Right   . TOTAL KNEE ARTHROPLASTY Left 04/27/2016   Procedure: LEFT TOTAL KNEE ARTHROPLASTY;  Surgeon: Salvatore Marvel, MD;  Location: [facility] OR;  Service: Orthopedics;  Laterality: Left;  . TOTAL KNEE ARTHROPLASTY Right 04/04/2018   Procedure: TOTAL KNEE ARTHROPLASTY;  Surgeon: Salvatore Marvel, MD;  Location: [facility] OR;  Service: Orthopedics;  Laterality: Right;    Medical  History: Past Medical History:  Diagnosis Date  . Essential hypertension    a. Dr. Welton Flakes in [place] managing.  Previously on antihypertensive, which was later d/c'd.  Marland Kitchen GERD (gastroesophageal reflux disease)   . Hyperlipidemia   . Primary localized osteoarthritis of left knee    patient had left total knee 04/27/16  . Primary localized osteoarthritis of right knee   . PUD (peptic ulcer disease)    a. Remote h/o coffee grounds emesis followed by GI eval.  On Nexium since.  . S/P total knee replacement, left     Family History: Family History  Problem Relation Age of Onset  . Parkinson's disease Mother        died @ 68  . Pneumonia Father        died in his 76's  . Heart disease Sister        1/2 sister - currently 77.  Marland Kitchen Hypertension Sister   . Stroke Sister     Social History   Socioeconomic History  . Marital status: Married    Spouse name: Not on file  . Number of children: Not on file  . Years of education: Not on file  . Highest education level: Not on file  Occupational History    Comment: Previously worked in [employer] in [employer]  Tobacco Use  . Smoking status: Never Smoker  . Smokeless tobacco: Never Used  Vaping Use  . Vaping Use: Never used  Substance and Sexual Activity  . Alcohol use: No  .  Drug use: No  . Sexual activity: Not on file  Other Topics Concern  . Not on file  Social History Narrative   Married takes care of wife who has Parkinson's and Early Alzheimers.  Fairly active.   Social Determinants of Health   Financial Resource Strain: Not on file  Food Insecurity: Not on file  Transportation Needs: Not on file  Physical Activity: Not on file  Stress: Not on file  Social Connections: Not on file  Intimate Partner Violence: Not on file      Review of Systems  Constitutional: Negative for activity change, chills, fatigue and unexpected weight change.  HENT: Negative for congestion, postnasal drip, rhinorrhea, sneezing and sore  throat.   Respiratory: Negative for cough, chest tightness, shortness of breath and wheezing.   Cardiovascular: Negative for chest pain and palpitations.       Stable blood pressure.  Gastrointestinal: Negative for abdominal pain, constipation, diarrhea, nausea and vomiting.  Endocrine: Negative for cold intolerance, heat intolerance, polydipsia and polyuria.  Musculoskeletal: Negative for arthralgias, back pain, joint swelling and neck pain.  Skin: Negative for rash.  Allergic/Immunologic: Negative for environmental allergies.  Neurological: Negative for dizziness, tremors, numbness and headaches.  Hematological: Negative for adenopathy. Does not bruise/bleed easily.  Psychiatric/Behavioral: Negative for behavioral problems (Depression), sleep disturbance and suicidal ideas. The patient is not nervous/anxious.     Today's Vitals   07/29/20 0937  BP: 132/80  Pulse: 66  Resp: 16  Temp: 98.2 F (36.8 C)  SpO2: 96%  Weight: 204 lb 9.6 oz (92.8 kg)  Height: 5\' 11"  (1.803 m)   Body mass index is 28.54 kg/m.  Physical Exam Vitals and nursing note reviewed.  Constitutional:      General: He is not in acute distress.    Appearance: Normal appearance. He is well-developed. He is not diaphoretic.  HENT:     Head: Normocephalic and atraumatic.     Nose: Nose normal.     Mouth/Throat:     Pharynx: No oropharyngeal exudate.  Eyes:     Pupils: Pupils are equal, round, and reactive to light.  Neck:     Thyroid: No thyromegaly.     Vascular: No carotid bruit or JVD.     Trachea: No tracheal deviation.  Cardiovascular:     Rate and Rhythm: Normal rate. Rhythm irregular.     Heart sounds: Murmur heard.  No friction rub. No gallop.   Pulmonary:     Effort: Pulmonary effort is normal. No respiratory distress.     Breath sounds: Normal breath sounds. No wheezing or rales.  Chest:     Chest wall: No tenderness.  Abdominal:     Palpations: Abdomen is soft.  Musculoskeletal:         General: Normal range of motion.     Cervical back: Normal range of motion and neck supple.  Lymphadenopathy:     Cervical: No cervical adenopathy.  Skin:    General: Skin is warm and dry.  Neurological:     General: No focal deficit present.     Mental Status: He is alert and oriented to person, place, and time.     Cranial Nerves: No cranial nerve deficit.  Psychiatric:        Mood and Affect: Mood normal.        Behavior: Behavior normal.        Thought Content: Thought content normal.        Judgment: Judgment normal.  Assessment/Plan: 1. Aortic ectasia (HCC) Reviewed abdominal aorta duplex with the patient. The abdominal aorta and other major abdominal arteries are normal without evidence of aneurysm and have normal flow velocities. Will conitnue to monitro closely.   2. Essential hypertension Stable. Continue metoprolol as prescribed   General Counseling: Danny [name] verbalizes understanding of the findings of todays visit and agrees with plan of treatment. I have discussed any further diagnostic evaluation that may be needed or ordered today. We also reviewed his medications today. he has been encouraged to call the office with any questions or concerns that should arise related to todays visit.   This patient was seen by Vincent Gros FNP Collaboration with Dr Lyndon Code as a part of collaborative care agreement  Total time spent: 25 Minutes   Time spent includes review of chart, medications, test results, and follow up plan with the patient.      Dr Lyndon Code Internal medicine

## 2020-08-26 ENCOUNTER — Other Ambulatory Visit: Payer: Self-pay

## 2020-08-26 MED ORDER — ESOMEPRAZOLE MAGNESIUM 40 MG PO CPDR: DELAYED_RELEASE_CAPSULE | ORAL | 3 refills | Status: DC

## 2020-11-19 ENCOUNTER — Other Ambulatory Visit: Payer: Self-pay | Admitting: Nurse Practitioner

## 2020-12-22 ENCOUNTER — Other Ambulatory Visit: Payer: Self-pay | Admitting: Nurse Practitioner

## 2020-12-24 ENCOUNTER — Other Ambulatory Visit: Payer: Self-pay | Admitting: Nurse Practitioner

## 2020-12-27 ENCOUNTER — Other Ambulatory Visit: Payer: Self-pay | Admitting: Nurse Practitioner

## 2020-12-30 ENCOUNTER — Other Ambulatory Visit: Payer: Self-pay

## 2020-12-30 ENCOUNTER — Other Ambulatory Visit: Payer: Self-pay | Admitting: Nurse Practitioner

## 2020-12-30 MED ORDER — ESOMEPRAZOLE MAGNESIUM 40 MG PO CPDR: DELAYED_RELEASE_CAPSULE | ORAL | 3 refills | Status: DC

## 2021-01-28 ENCOUNTER — Ambulatory Visit: Payer: Medicare Other | Admitting: Nurse Practitioner

## 2021-01-28 ENCOUNTER — Other Ambulatory Visit: Payer: Self-pay

## 2021-01-28 ENCOUNTER — Encounter: Payer: Self-pay | Admitting: Nurse Practitioner

## 2021-01-28 DIAGNOSIS — E782 Mixed hyperlipidemia: Secondary | ICD-10-CM

## 2021-01-28 DIAGNOSIS — I10 Essential (primary) hypertension: Secondary | ICD-10-CM | POA: Diagnosis not present

## 2021-01-28 DIAGNOSIS — K219 Gastro-esophageal reflux disease without esophagitis: Secondary | ICD-10-CM | POA: Diagnosis not present

## 2021-01-28 NOTE — Progress Notes (Signed)
[facility] [address] [place], [place] [address]  Internal MEDICINE  Office Visit Note  Patient Name: Danny [name]  [id number]  [id number]  Date of Service: 01/28/2021  Chief Complaint  Patient presents with  . Follow-up       . Hypertension  . Arthritis  . Gastroesophageal Reflux  . Hyperlipidemia    HPI  Danny [name] presents for a follow-up visit regarding hypertension gastroesophageal reflux disease hyperlipidemia and arthritis. -His blood pressure is well controlled with his current medications, he is on metoprolol succinate 25 mg daily. -His acid reflux is controlled as long as he takes his medication, he takes esomeprazole 40 mg daily. -He had a lipid profile done in September 2021, will repeat his lipid profile again in September this year. -He is only on 3 medications and he is doing well.  He takes pravastatin metoprolol succinate and esomeprazole.  He is compliant with his medication regimen. -He does have a history of arthritis.  He reports that he has no pain or achy joints and he did not even know he had arthritis.  He has no other concerns today.  And he does not need any refills on his medications at this time.   Current Medication: Outpatient Encounter Medications as of 01/28/2021  Medication Sig  . esomeprazole (NEXIUM) 40 MG capsule TAKE 1 CAPSULE BY MOUTH EVERY DAY  . metoprolol succinate (TOPROL-XL) 25 MG 24 hr tablet Take 1 tablet (25 mg total) by mouth daily.  . pravastatin (PRAVACHOL) 40 MG tablet TAKE 1 TABLET BY MOUTH EVERYDAY AT BEDTIME   No facility-administered encounter medications on file as of 01/28/2021.    Surgical History: Past Surgical History:  Procedure Laterality Date  . CATARACT EXTRACTION, BILATERAL    . CHOLECYSTECTOMY  2003  . 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 REPLACEMENT TOTAL KNEE Right   . TOTAL KNEE ARTHROPLASTY Left 04/27/2016   Procedure: LEFT TOTAL KNEE ARTHROPLASTY;  Surgeon: Salvatore Marvel,  MD;  Location: [facility] OR;  Service: Orthopedics;  Laterality: Left;  . TOTAL KNEE ARTHROPLASTY Right 04/04/2018   Procedure: TOTAL KNEE ARTHROPLASTY;  Surgeon: Salvatore Marvel, MD;  Location: [facility] OR;  Service: Orthopedics;  Laterality: Right;    Medical History: Past Medical History:  Diagnosis Date  . Essential hypertension    a. Dr. Welton Flakes in [place] managing.  Previously on antihypertensive, which was later d/c'd.  Marland Kitchen GERD (gastroesophageal reflux disease)   . Hyperlipidemia   . Primary localized osteoarthritis of left knee    patient had left total knee 04/27/16  . Primary localized osteoarthritis of right knee   . PUD (peptic ulcer disease)    a. Remote h/o coffee grounds emesis followed by GI eval.  On Nexium since.  . S/P total knee replacement, left     Family History: Family History  Problem Relation Age of Onset  . Parkinson's disease Mother        died @ 87  . Pneumonia Father        died in his 40's  . Heart disease Sister        1/2 sister - currently 38.  Marland Kitchen Hypertension Sister   . Stroke Sister     Social History   Socioeconomic History  . Marital status: Married    Spouse name: Not on file  . Number of children: Not on file  . Years of education: Not on file  . Highest education level: Not on  file  Occupational History    Comment: Previously worked in [employer] in [employer]  Tobacco Use  . Smoking status: Never Smoker  . Smokeless tobacco: Never Used  Vaping Use  . Vaping Use: Never used  Substance and Sexual Activity  . Alcohol use: No  . Drug use: No  . Sexual activity: Not on file  Other Topics Concern  . Not on file  Social History Narrative   Married takes care of wife who has Parkinson's and Early Alzheimers.  Fairly active.   Social Determinants of Health   Financial Resource Strain: Not on file  Food Insecurity: Not on file  Transportation Needs: Not on file  Physical Activity: Not on file  Stress: Not on file  Social Connections: Not  on file  Intimate Partner Violence: Not on file      Review of Systems  Constitutional: Negative for chills, fatigue and unexpected weight change.  HENT: Negative for congestion, rhinorrhea, sneezing and sore throat.   Eyes: Negative for redness.  Respiratory: Negative for cough, chest tightness and shortness of breath.   Cardiovascular: Negative for chest pain and palpitations.  Gastrointestinal: Negative for abdominal pain, constipation, diarrhea, nausea and vomiting.  Genitourinary: Negative for dysuria and frequency.  Musculoskeletal: Negative for arthralgias, back pain, joint swelling and neck pain.  Skin: Negative for rash.  Neurological: Negative.  Negative for tremors and numbness.  Hematological: Negative for adenopathy. Does not bruise/bleed easily.  Psychiatric/Behavioral: Negative for behavioral problems (Depression), sleep disturbance and suicidal ideas. The patient is not nervous/anxious.     Vital Signs: BP 134/78   Pulse 62   Temp 98.4 F (36.9 C)   Resp 16   Ht 5\' 8"  (1.727 m)   Wt 202 lb 6.4 oz (91.8 kg)   SpO2 90%   BMI 30.77 kg/m    Physical Exam Constitutional:      General: He is not in acute distress.    Appearance: Normal appearance. He is well-developed. He is obese. He is not diaphoretic.  HENT:     Head: Normocephalic and atraumatic.  Neck:     Thyroid: No thyromegaly.     Vascular: No JVD.     Trachea: No tracheal deviation.  Cardiovascular:     Rate and Rhythm: Normal rate and regular rhythm.     Pulses: Normal pulses.     Heart sounds: Normal heart sounds. No murmur heard. No friction rub. No gallop.   Pulmonary:     Effort: Pulmonary effort is normal. No respiratory distress.     Breath sounds: Normal breath sounds. No stridor. No wheezing, rhonchi or rales.  Chest:     Chest wall: No tenderness.  Musculoskeletal:     Cervical back: Normal range of motion and neck supple.  Skin:    General: Skin is warm and dry.     Capillary  Refill: Capillary refill takes less than 2 seconds.     Coloration: Skin is not pale.     Findings: No erythema or rash.  Neurological:     Mental Status: He is alert and oriented to person, place, and time.     Motor: No abnormal muscle tone.     Deep Tendon Reflexes: Reflexes are normal and symmetric.  Psychiatric:        Mood and Affect: Mood normal.        Behavior: Behavior normal.        Thought Content: Thought content normal.  Judgment: Judgment normal.    Assessment/Plan: 1. Essential hypertension Stable with current medications.  No changes to treatment regimen.  2. Mixed hyperlipidemia Taking pravastatin, will need to recheck his lipid panel in September of this year.  3. Gastroesophageal reflux disease without esophagitis Acid reflux is well controlled with esomeprazole.  No changes to treatment regimen.    General Counseling: Danny [name] verbalizes understanding of the findings of todays visit and agrees with plan of treatment. I have discussed any further diagnostic evaluation that may be needed or ordered today. We also reviewed his medications today. he has been encouraged to call the office with any questions or concerns that should arise related to todays visit.    No orders of the defined types were placed in this encounter.   No orders of the defined types were placed in this encounter.  .Return for CPE, Aryon Nham PCP in september already scheduled.   Total time spent: 30 Minutes Time spent includes review of chart, medications, test results, and follow up plan with the patient.    Controlled Substance Database was reviewed by me.  This patient was seen by Sallyanne Kuster, FNP-C in collaboration with Dr. Beverely Risen as a part of collaborative care agreement.   Dr Lyndon Code Internal medicine

## 2021-03-29 ENCOUNTER — Other Ambulatory Visit: Payer: Self-pay | Admitting: Internal Medicine

## 2021-05-29 ENCOUNTER — Other Ambulatory Visit: Payer: Self-pay | Admitting: Nurse Practitioner

## 2021-05-29 DIAGNOSIS — I10 Essential (primary) hypertension: Secondary | ICD-10-CM

## 2021-06-05 ENCOUNTER — Encounter: Payer: Self-pay | Admitting: Nurse Practitioner

## 2021-06-05 ENCOUNTER — Other Ambulatory Visit: Payer: Self-pay

## 2021-06-05 ENCOUNTER — Ambulatory Visit (INDEPENDENT_AMBULATORY_CARE_PROVIDER_SITE_OTHER): Payer: Medicare Other | Admitting: Nurse Practitioner

## 2021-06-05 VITALS — BP 122/90 | HR 61 | Temp 98.6°F | Resp 16 | Ht 68.0 in | Wt 204.0 lb

## 2021-06-05 DIAGNOSIS — K219 Gastro-esophageal reflux disease without esophagitis: Secondary | ICD-10-CM | POA: Diagnosis not present

## 2021-06-05 DIAGNOSIS — E782 Mixed hyperlipidemia: Secondary | ICD-10-CM

## 2021-06-05 DIAGNOSIS — I10 Essential (primary) hypertension: Secondary | ICD-10-CM

## 2021-06-05 DIAGNOSIS — R300 Dysuria: Secondary | ICD-10-CM

## 2021-06-05 DIAGNOSIS — I77819 Aortic ectasia, unspecified site: Secondary | ICD-10-CM | POA: Diagnosis not present

## 2021-06-05 DIAGNOSIS — Z0001 Encounter for general adult medical examination with abnormal findings: Secondary | ICD-10-CM | POA: Diagnosis not present

## 2021-06-05 DIAGNOSIS — Z23 Encounter for immunization: Secondary | ICD-10-CM

## 2021-06-05 MED ORDER — PRAVASTATIN SODIUM 40 MG PO TABS
ORAL_TABLET | ORAL | 3 refills | Status: DC
Start: 2021-06-05 — End: 2022-06-05

## 2021-06-05 MED ORDER — METOPROLOL SUCCINATE ER 25 MG PO TB24: 25.0000 mg | ORAL_TABLET | Freq: Every day | ORAL | 3 refills | Status: DC

## 2021-06-05 MED ORDER — ESOMEPRAZOLE MAGNESIUM 40 MG PO CPDR: 40.0000 mg | DELAYED_RELEASE_CAPSULE | Freq: Every day | ORAL | 3 refills | Status: DC

## 2021-06-05 NOTE — Progress Notes (Signed)
[facility] [address] [place], [place] [address]  Internal MEDICINE  Office Visit Note  Patient Name: Danny [name]  [id number]  [id number]  Date of Service: 06/05/2021  Chief Complaint  Patient presents with   Medicare Wellness   Hyperlipidemia   Hypertension    HPI Eren presents for an annual well visit and physical exam. he has a history of arthritis, gastroesophageal reflux, hyperlipidemia, peptic ulcer disease, and hypertension.  Surgical history is significant for cholecystectomy, bilateral total knee replacement, bilateral cataract extraction, partial right fifth finger amputation. He has declined recommended vaccinations due to age. He has received 3 doses of the COVID vaccine. He lives at home with his wife. He denies any pain. He does not need any routine labs at this time. Will consider checking labs at next office visit.  Nyron had an echocardiogram done in October 2021.  The results showed mild aortic sclerosis and aortic regurgitation, aortic valve sclerosis, mild dilated ascending aorta, mild to moderate tricuspid regurgitation, and borderline pulmonary arterial hypertension.    Current Medication: Outpatient Encounter Medications as of 06/05/2021  Medication Sig   [DISCONTINUED] esomeprazole (NEXIUM) 40 MG capsule TAKE 1 CAPSULE BY MOUTH EVERY DAY   [DISCONTINUED] metoprolol succinate (TOPROL-XL) 25 MG 24 hr tablet Take 1 tablet (25 mg total) by mouth daily.   [DISCONTINUED] pravastatin (PRAVACHOL) 40 MG tablet TAKE 1 TABLET BY MOUTH EVERYDAY AT BEDTIME   esomeprazole (NEXIUM) 40 MG capsule Take 1 capsule (40 mg total) by mouth daily.   metoprolol succinate (TOPROL-XL) 25 MG 24 hr tablet Take 1 tablet (25 mg total) by mouth daily.   pravastatin (PRAVACHOL) 40 MG tablet TAKE 1 TABLET BY MOUTH EVERYDAY AT BEDTIME   No facility-administered encounter medications on file as of 06/05/2021.    Surgical History: Past Surgical History:  Procedure  Laterality Date   CATARACT EXTRACTION, BILATERAL     CHOLECYSTECTOMY  2003   FINGER AMPUTATION     partial - pinkie on R hand    REPLACEMENT TOTAL KNEE Right    TOTAL KNEE ARTHROPLASTY Left 04/27/2016   Procedure: LEFT TOTAL KNEE ARTHROPLASTY;  Surgeon: Salvatore Marvel, MD;  Location: [facility] OR;  Service: Orthopedics;  Laterality: Left;   TOTAL KNEE ARTHROPLASTY Right 04/04/2018   Procedure: TOTAL KNEE ARTHROPLASTY;  Surgeon: Salvatore Marvel, MD;  Location: [facility] OR;  Service: Orthopedics;  Laterality: Right;    Medical History: Past Medical History:  Diagnosis Date   Essential hypertension    a. Dr. Welton Flakes in [place] managing.  Previously on antihypertensive, which was later d/c'd.   GERD (gastroesophageal reflux disease)    Hyperlipidemia    Primary localized osteoarthritis of left knee    patient had left total knee 04/27/16   Primary localized osteoarthritis of right knee    PUD (peptic ulcer disease)    a. Remote h/o coffee grounds emesis followed by GI eval.  On Nexium since.   S/P total knee replacement, left     Family History: Family History  Problem Relation Age of Onset   Parkinson's disease Mother        died @ 60   Pneumonia Father        died in his 36's   Heart disease Sister        1/2 sister - currently 8.   Hypertension Sister    Stroke Sister     Social History   Socioeconomic History   Marital status: Married    Spouse name: Not on file  Number of children: Not on file   Years of education: Not on file   Highest education level: Not on file  Occupational History    Comment: Previously worked in [employer] in [employer]  Tobacco Use   Smoking status: Never   Smokeless tobacco: Never  Vaping Use   Vaping Use: Never used  Substance and Sexual Activity   Alcohol use: No   Drug use: No   Sexual activity: Not on file  Other Topics Concern   Not on file  Social History Narrative   Married takes care of wife who has Parkinson's and Early Alzheimers.   Fairly active.   Social Determinants of Health   Financial Resource Strain: Not on file  Food Insecurity: Not on file  Transportation Needs: Not on file  Physical Activity: Not on file  Stress: Not on file  Social Connections: Not on file  Intimate Partner Violence: Not on file      Review of Systems  Constitutional:  Negative for activity change, appetite change, chills, fatigue, fever and unexpected weight change.  HENT: Negative.  Negative for congestion, ear pain, rhinorrhea, sore throat and trouble swallowing.   Eyes: Negative.   Respiratory: Negative.  Negative for cough, chest tightness, shortness of breath and wheezing.   Cardiovascular: Negative.  Negative for chest pain.  Gastrointestinal: Negative.  Negative for abdominal pain, blood in stool, constipation, diarrhea, nausea and vomiting.  Endocrine: Negative.   Genitourinary: Negative.  Negative for difficulty urinating, dysuria, frequency, hematuria and urgency.  Musculoskeletal: Negative.  Negative for arthralgias, back pain, joint swelling, myalgias and neck pain.  Skin: Negative.  Negative for rash and wound.  Allergic/Immunologic: Negative.  Negative for immunocompromised state.  Neurological: Negative.  Negative for dizziness, seizures, numbness and headaches.  Hematological: Negative.   Psychiatric/Behavioral: Negative.  Negative for behavioral problems, self-injury and suicidal ideas. The patient is not nervous/anxious.    Vital Signs: BP 122/90   Pulse 61   Temp 98.6 F (37 C)   Resp 16   Ht 5\' 8"  (1.727 m)   Wt 204 lb (92.5 kg)   SpO2 97%   BMI 31.02 kg/m    Physical Exam Vitals reviewed.  Constitutional:      General: He is awake. He is not in acute distress.    Appearance: Normal appearance. He is well-developed. He is obese. He is not ill-appearing or diaphoretic.  HENT:     Head: Normocephalic and atraumatic.     Right Ear: Tympanic membrane, ear canal and external ear normal.     Left Ear:  Tympanic membrane, ear canal and external ear normal.     Nose: Nose normal.     Mouth/Throat:     Lips: Pink.     Mouth: Mucous membranes are moist.     Pharynx: Oropharynx is clear. Uvula midline. No oropharyngeal exudate.  Eyes:     General: Lids are normal. Vision grossly intact. Gaze aligned appropriately. No scleral icterus.       Right eye: No discharge.        Left eye: No discharge.     Extraocular Movements: Extraocular movements intact.     Conjunctiva/sclera: Conjunctivae normal.     Pupils: Pupils are equal, round, and reactive to light.     Funduscopic exam:    Right eye: Red reflex present.        Left eye: Red reflex present. Neck:     Thyroid: No thyromegaly.     Vascular: No JVD.  Trachea: Trachea and phonation normal. No tracheal deviation.  Cardiovascular:     Rate and Rhythm: Normal rate and regular rhythm.     Pulses: Normal pulses.     Heart sounds: Normal heart sounds, S1 normal and S2 normal. No murmur heard.   No friction rub. No gallop.  Pulmonary:     Effort: Pulmonary effort is normal. No accessory muscle usage or respiratory distress.     Breath sounds: Normal breath sounds and air entry. No stridor. No wheezing or rales.  Chest:     Chest wall: No tenderness.  Abdominal:     General: Bowel sounds are normal. There is no distension.     Palpations: Abdomen is soft. There is no shifting dullness, fluid wave, mass or pulsatile mass.     Tenderness: There is no abdominal tenderness. There is no guarding or rebound.  Musculoskeletal:        General: No tenderness or deformity. Normal range of motion.     Cervical back: Normal range of motion and neck supple.     Right lower leg: No edema.     Left lower leg: No edema.  Lymphadenopathy:     Cervical: No cervical adenopathy.  Skin:    General: Skin is warm and dry.     Capillary Refill: Capillary refill takes less than 2 seconds.     Coloration: Skin is not pale.     Findings: No erythema or  rash.  Neurological:     Mental Status: He is alert and oriented to person, place, and time.     Cranial Nerves: No cranial nerve deficit.     Motor: No abnormal muscle tone.     Coordination: Coordination normal.     Gait: Gait normal.     Deep Tendon Reflexes: Reflexes are normal and symmetric.  Psychiatric:        Mood and Affect: Mood and affect normal.        Behavior: Behavior normal. Behavior is cooperative.        Thought Content: Thought content normal.        Judgment: Judgment normal.       Assessment/Plan: 1. Encounter for general adult medical examination with abnormal findings Age-appropriate preventive screenings and vaccinations discussed, annual physical exam completed. PHM updated. No preventive screenings due at this time.   2. Essential hypertension Stable, continue medication as prescribed - metoprolol succinate (TOPROL-XL) 25 MG 24 hr tablet; Take 1 tablet (25 mg total) by mouth daily.  Dispense: 90 tablet; Refill: 3  3. Mixed hyperlipidemia Stable, refill ordered, will discuss repeating lipid panel at next office visit.  - pravastatin (PRAVACHOL) 40 MG tablet; TAKE 1 TABLET BY MOUTH EVERYDAY AT BEDTIME  Dispense: 90 tablet; Refill: 3  4. Gastroesophageal reflux disease without esophagitis Stable, refill ordered - esomeprazole (NEXIUM) 40 MG capsule; Take 1 capsule (40 mg total) by mouth daily.  Dispense: 90 capsule; Refill: 3  5. Aortic ectasia (HCC) Repeat echocardiogram -Echocardiogram complete  6. Dysuria Routine urinalysis done - UA/M w/rflx Culture, Routine  7. Needs flu shot Administered in office today - Flu Vaccine MDCK QUAD PF      General Counseling: Va verbalizes understanding of the findings of todays visit and agrees with plan of treatment. I have discussed any further diagnostic evaluation that may be needed or ordered today. We also reviewed his medications today. he has been encouraged to call the office with any questions or  concerns that should arise related to todays  visit.    Orders Placed This Encounter  Procedures   Flu Vaccine MDCK QUAD PF   UA/M w/rflx Culture, Routine    Meds ordered this encounter  Medications   metoprolol succinate (TOPROL-XL) 25 MG 24 hr tablet    Sig: Take 1 tablet (25 mg total) by mouth daily.    Dispense:  90 tablet    Refill:  3   pravastatin (PRAVACHOL) 40 MG tablet    Sig: TAKE 1 TABLET BY MOUTH EVERYDAY AT BEDTIME    Dispense:  90 tablet    Refill:  3   esomeprazole (NEXIUM) 40 MG capsule    Sig: Take 1 capsule (40 mg total) by mouth daily.    Dispense:  90 capsule    Refill:  3    Return in about 6 months (around 12/03/2021) for F/U, med refill, Azariah Latendresse PCP.   Total time spent:30 Minutes Time spent includes review of chart, medications, test results, and follow up plan with the patient.   [place] Controlled Substance Database was reviewed by me.  This patient was seen by Sallyanne Kuster, FNP-C in collaboration with Dr. Beverely Risen as a part of collaborative care agreement.  Kennethia Lynes R. Tedd Sias, MSN, FNP-C Internal medicine

## 2021-06-11 LAB — UA/M W/RFLX CULTURE, ROUTINE
Bilirubin, UA: NEGATIVE
Glucose, UA: NEGATIVE
Ketones, UA: NEGATIVE
Nitrite, UA: NEGATIVE
Protein,UA: NEGATIVE
RBC, UA: NEGATIVE
Specific Gravity, UA: 1.02 (ref 1.005–1.030)
Urobilinogen, Ur: 1 mg/dL (ref 0.2–1.0)
pH, UA: 5.5 (ref 5.0–7.5)

## 2021-06-11 LAB — MICROSCOPIC EXAMINATION
Bacteria, UA: NONE SEEN
Casts: NONE SEEN /lpf
RBC, Urine: NONE SEEN /hpf (ref 0–2)

## 2021-06-11 LAB — URINE CULTURE, REFLEX

## 2021-06-13 NOTE — Progress Notes (Signed)
Routine urinalysis and urine culture sent for annual wellness visit. Patient is asymptomatic but urine culture was positive. Will treat if patient calls with symptoms.

## 2021-06-28 ENCOUNTER — Other Ambulatory Visit: Payer: Self-pay | Admitting: Internal Medicine

## 2021-06-28 DIAGNOSIS — K219 Gastro-esophageal reflux disease without esophagitis: Secondary | ICD-10-CM

## 2021-12-04 ENCOUNTER — Ambulatory Visit: Payer: Medicare Other | Admitting: Physician Assistant

## 2021-12-04 ENCOUNTER — Encounter: Payer: Self-pay | Admitting: Physician Assistant

## 2021-12-04 DIAGNOSIS — I10 Essential (primary) hypertension: Secondary | ICD-10-CM | POA: Diagnosis not present

## 2021-12-04 DIAGNOSIS — E782 Mixed hyperlipidemia: Secondary | ICD-10-CM

## 2021-12-04 DIAGNOSIS — R7989 Other specified abnormal findings of blood chemistry: Secondary | ICD-10-CM

## 2021-12-04 DIAGNOSIS — R5383 Other fatigue: Secondary | ICD-10-CM

## 2021-12-04 DIAGNOSIS — M7989 Other specified soft tissue disorders: Secondary | ICD-10-CM | POA: Diagnosis not present

## 2021-12-04 DIAGNOSIS — R011 Cardiac murmur, unspecified: Secondary | ICD-10-CM | POA: Diagnosis not present

## 2021-12-04 DIAGNOSIS — I77819 Aortic ectasia, unspecified site: Secondary | ICD-10-CM

## 2021-12-04 DIAGNOSIS — Z125 Encounter for screening for malignant neoplasm of prostate: Secondary | ICD-10-CM

## 2021-12-04 MED ORDER — FUROSEMIDE 20 MG PO TABS: ORAL_TABLET | ORAL | 3 refills | Status: DC

## 2021-12-04 NOTE — Progress Notes (Signed)
[facility] ?[address] ?[place], [place] [address] ? ?Internal MEDICINE  ?Office Visit Note ? ?Patient Name: Danny DarkBobby M [name] ? [id number]  ?[id number] ? ?Date of Service: 12/09/2021 ? ?Chief Complaint  ?Patient presents with  ? Follow-up  ?  Swelling from the knees down, noticed it about 2 weeks ago, has not used compression socks  ? Gastroesophageal Reflux  ? Hyperlipidemia  ? Hypertension  ? Medication Refill  ? ? ?HPI ?Pt is here for routine follow up ?-Some swelling in both legs for the past 2 weeks, R>L. States he sometimes will have swelling in feet, but this is worse than previously and is from his knees down. Denies any pain or changes in sensation. Has not tried compression stockings ?-Denies any SOB  ?-does 10 laps around his house daily, normally will be out mowing for exercise as well ?-Upon chart review patient had an echo ordered last visit for follow up of previously noted murmur and aortic ectasia. But this was never completed. Discussed that I would like to move forward with scheduling this now for monitoring as well as further investigation of leg swelling ?-Doesn't usually check BP  ? ?Current Medication: ?Outpatient Encounter Medications as of 12/04/2021  ?Medication Sig  ? esomeprazole (NEXIUM) 40 MG capsule TAKE 1 CAPSULE BY MOUTH EVERY DAY  ? furosemide (LASIX) 20 MG tablet Take 1 tablet by mouth daily for 3 days then daily as needed  ? metoprolol succinate (TOPROL-XL) 25 MG 24 hr tablet Take 1 tablet (25 mg total) by mouth daily.  ? pravastatin (PRAVACHOL) 40 MG tablet TAKE 1 TABLET BY MOUTH EVERYDAY AT BEDTIME  ? ?No facility-administered encounter medications on file as of 12/04/2021.  ? ? ?Surgical History: ?Past Surgical History:  ?Procedure Laterality Date  ? CATARACT EXTRACTION, BILATERAL    ? CHOLECYSTECTOMY  2003  ? FINGER AMPUTATION    ? partial - pinkie on R hand   ? REPLACEMENT TOTAL KNEE Right   ? TOTAL KNEE ARTHROPLASTY Left 04/27/2016  ? Procedure: LEFT TOTAL KNEE  ARTHROPLASTY;  Surgeon: Danny Marvelobert Wainer, MD;  [facility];  Service: Orthopedics;  Laterality: Left;  ? TOTAL KNEE ARTHROPLASTY Right 04/04/2018  ? Procedure: TOTAL KNEE ARTHROPLASTY;  Surgeon: Danny MarvelWainer, Robert, MD;  [facility];  Service: Orthopedics;  Laterality: Right;  ? ? ?Medical History: ?Past Medical History:  ?Diagnosis Date  ? Essential hypertension   ? [name].  Previously on antihypertensive, which was later d/c'd.  ? GERD (gastroesophageal reflux disease)   ? Hyperlipidemia   ? Primary localized osteoarthritis of left knee   ? patient had left total knee 04/27/16  ? Primary localized osteoarthritis of right knee   ? PUD (peptic ulcer disease)   ? a. Remote h/o coffee grounds emesis followed by GI eval.  On Nexium since.  ? S/P total knee replacement, left   ? ? ?Family History: ?Family History  ?Problem Relation Age of Onset  ? Parkinson's disease Mother   ?     died @ 8294  ? Pneumonia Father   ?     died in his 8520's  ? Heart disease Sister   ?     1/2 sister - currently [id number].  ? Hypertension Sister   ? Stroke Sister   ? ? ?Social History  ? ?Socioeconomic History  ? Marital status: Married  ?  Spouse name: Not on file  ? Number of children: Not on file  ? Years of  education: Not on file  ? Highest education level: Not on file  ?Occupational History  ?  Comment: Previously worked in [employer] in [employer]  ?Tobacco Use  ? Smoking status: Never  ? Smokeless tobacco: Never  ?Vaping Use  ? Vaping Use: Never used  ?Substance and Sexual Activity  ? Alcohol use: No  ? Drug use: No  ? Sexual activity: Not on file  ?Other Topics Concern  ? Not on file  ?Social History Narrative  ? Married takes care of wife who has Parkinson's and Early Alzheimers.  Fairly active.  ? ?Social Determinants of Health  ? ?Financial Resource Strain: Not on file  ?Food Insecurity: Not on file  ?Transportation Needs: Not on file  ?Physical Activity: Not on file  ?Stress: Not on file  ?Social Connections:  Not on file  ?Intimate Partner Violence: Not on file  ? ? ? ? ?Review of Systems  ?Constitutional:  Negative for chills, fatigue and unexpected weight change.  ?HENT:  Negative for congestion, rhinorrhea, sneezing and sore throat.   ?Eyes:  Negative for redness.  ?Respiratory:  Negative for cough, chest tightness and shortness of breath.   ?Cardiovascular:  Positive for leg swelling. Negative for chest pain and palpitations.  ?Gastrointestinal:  Negative for abdominal pain, constipation, diarrhea, nausea and vomiting.  ?Genitourinary:  Negative for dysuria and frequency.  ?Musculoskeletal:  Negative for arthralgias, back pain, joint swelling and neck pain.  ?Skin:  Negative for rash.  ?Neurological: Negative.  Negative for tremors and numbness.  ?Hematological:  Negative for adenopathy. Does not bruise/bleed easily.  ?Psychiatric/Behavioral:  Negative for behavioral problems (Depression), sleep disturbance and suicidal ideas. The patient is not nervous/anxious.   ? ?Vital Signs: ?BP (!) 148/90   Pulse 63   Temp 98.5 ?F (36.9 ?C)   Resp 16   Ht 5\' 11"  (1.803 m)   Wt 212 lb 9.6 oz (96.4 kg)   SpO2 96%   BMI 29.65 kg/m?  ? ? ?Physical Exam ?Constitutional:   ?   General: He is not in acute distress. ?   Appearance: Normal appearance. He is well-developed. He is obese. He is not diaphoretic.  ?HENT:  ?   Head: Normocephalic and atraumatic.  ?Neck:  ?   Thyroid: No thyromegaly.  ?   Vascular: No JVD.  ?   Trachea: No tracheal deviation.  ?Cardiovascular:  ?   Rate and Rhythm: Normal rate and regular rhythm.  ?   Pulses: Normal pulses.  ?   Heart sounds: Normal heart sounds. No murmur heard. ?  No friction rub. No gallop.  ?Pulmonary:  ?   Effort: Pulmonary effort is normal. No respiratory distress.  ?   Breath sounds: Normal breath sounds. No stridor. No wheezing, rhonchi or rales.  ?Chest:  ?   Chest wall: No tenderness.  ?Musculoskeletal:     ?   General: No tenderness.  ?   Cervical back: Normal range of motion  and neck supple.  ?   Right lower leg: Edema present.  ?   Left lower leg: Edema present.  ?   Comments: No pain on palpation of LE, no skin or sensation changes  ?Skin: ?   General: Skin is warm and dry.  ?   Capillary Refill: Capillary refill takes less than 2 seconds.  ?   Coloration: Skin is not pale.  ?   Findings: No erythema or rash.  ?Neurological:  ?   Mental Status: He is alert and oriented  to person, place, and time.  ?   Motor: No abnormal muscle tone.  ?   Deep Tendon Reflexes: Reflexes are normal and symmetric.  ?Psychiatric:     ?   Mood and Affect: Mood normal.     ?   Behavior: Behavior normal.     ?   Thought Content: Thought content normal.     ?   Judgment: Judgment normal.  ? ? ? ? ? ?Assessment/Plan: ?1. Leg swelling ?Will start on lasix for 3 days then continue only as needed for leg swelling. Advised to elevated legs and try compression stockings. Will check CMP along with other labs. Will also schedule echo previously ordered. Advised to call or go to ED if acute worsening or new symptoms arise. ?- furosemide (LASIX) 20 MG tablet; Take 1 tablet by mouth daily for 3 days then daily as needed  Dispense: 30 tablet; Refill: 3 ?- Comprehensive metabolic panel ? ?2. Systolic murmur ?Will schedule echo previously ordered ? ?3. Aortic ectasia (HCC) ?Will schedule echo previously ordered ? ?4. Essential hypertension ?Slightly elevated in office likely due to fluid overload and will add lasix for 3 days then as needed. Pt will monitor BP at home. ? ?5. Mixed hyperlipidemia ?Continue pravastatin and will update labs ?- Lipid Panel With LDL/HDL Ratio ? ?6. Screening for prostate cancer ?- PSA Total (Reflex To Free) ? ?7. Abnormal thyroid blood test ?- TSH + free T4 ? ?8. Other fatigue ?- Comprehensive metabolic panel ?- CBC w/Diff/Platelet ?- TSH + free T4 ?- Lipid Panel With LDL/HDL Ratio ?- PSA Total (Reflex To Free) ? ? ?General Counseling: Ilhan verbalizes understanding of the findings of todays  visit and agrees with plan of treatment. I have discussed any further diagnostic evaluation that may be needed or ordered today. We also reviewed his medications today. he has been encouraged to call the office wit

## 2021-12-06 LAB — COMPREHENSIVE METABOLIC PANEL
ALT: 12 IU/L (ref 0–44)
AST: 18 IU/L (ref 0–40)
Albumin/Globulin Ratio: 1.8 (ref 1.2–2.2)
Albumin: 4.4 g/dL (ref 3.5–4.6)
Alkaline Phosphatase: 113 IU/L (ref 44–121)
BUN/Creatinine Ratio: 14 (ref 10–24)
BUN: 18 mg/dL (ref 10–36)
Bilirubin Total: 1.7 mg/dL — ABNORMAL HIGH (ref 0.0–1.2)
CO2: 25 mmol/L (ref 20–29)
Calcium: 9.6 mg/dL (ref 8.6–10.2)
Chloride: 102 mmol/L (ref 96–106)
Creatinine, Ser: 1.26 mg/dL (ref 0.76–1.27)
Globulin, Total: 2.5 g/dL (ref 1.5–4.5)
Glucose: 100 mg/dL — ABNORMAL HIGH (ref 70–99)
Potassium: 5.3 mmol/L — ABNORMAL HIGH (ref 3.5–5.2)
Sodium: 141 mmol/L (ref 134–144)
Total Protein: 6.9 g/dL (ref 6.0–8.5)
eGFR: 54 mL/min/{1.73_m2} — ABNORMAL LOW (ref >59)

## 2021-12-06 LAB — CBC WITH DIFFERENTIAL/PLATELET
Basophils Absolute: 0 10*3/uL (ref 0.0–0.2)
Basos: 0 %
EOS (ABSOLUTE): 0.3 10*3/uL (ref 0.0–0.4)
Eos: 3 %
Hematocrit: 48.3 % (ref 37.5–51.0)
Hemoglobin: 16.2 g/dL (ref 13.0–17.7)
Immature Grans (Abs): 0 10*3/uL (ref 0.0–0.1)
Immature Granulocytes: 0 %
Lymphocytes Absolute: 2.1 10*3/uL (ref 0.7–3.1)
Lymphs: 25 %
MCH: 29.5 pg (ref 26.6–33.0)
MCHC: 33.5 g/dL (ref 31.5–35.7)
MCV: 88 fL (ref 79–97)
Monocytes Absolute: 0.7 10*3/uL (ref 0.1–0.9)
Monocytes: 8 %
Neutrophils Absolute: 5.3 10*3/uL (ref 1.4–7.0)
Neutrophils: 64 %
Platelets: 177 10*3/uL (ref 150–450)
RBC: 5.49 x10E6/uL (ref 4.14–5.80)
RDW: 12.6 % (ref 11.6–15.4)
WBC: 8.4 10*3/uL (ref 3.4–10.8)

## 2021-12-06 LAB — LIPID PANEL WITH LDL/HDL RATIO
Cholesterol, Total: 146 mg/dL (ref 100–199)
HDL: 44 mg/dL (ref >39)
LDL Chol Calc (NIH): 83 mg/dL (ref 0–99)
LDL/HDL Ratio: 1.9 ratio (ref 0.0–3.6)
Triglycerides: 101 mg/dL (ref 0–149)
VLDL Cholesterol Cal: 19 mg/dL (ref 5–40)

## 2021-12-06 LAB — PSA TOTAL (REFLEX TO FREE): Prostate Specific Ag, Serum: 3.5 ng/mL (ref 0.0–4.0)

## 2021-12-06 LAB — TSH+FREE T4
Free T4: 1.2 ng/dL (ref 0.82–1.77)
TSH: 2.63 u[IU]/mL (ref 0.450–4.500)

## 2021-12-17 NOTE — Progress Notes (Signed)
I have reviewed the lab results. There are no critically abnormal values requiring immediate intervention but there are some abnormals that will be discussed at the next office visit.  

## 2021-12-29 ENCOUNTER — Ambulatory Visit: Payer: Medicare Other

## 2021-12-29 DIAGNOSIS — I77819 Aortic ectasia, unspecified site: Secondary | ICD-10-CM

## 2021-12-29 DIAGNOSIS — R011 Cardiac murmur, unspecified: Secondary | ICD-10-CM

## 2022-01-05 ENCOUNTER — Ambulatory Visit: Payer: Medicare Other | Admitting: Nurse Practitioner

## 2022-01-05 ENCOUNTER — Encounter: Payer: Self-pay | Admitting: Nurse Practitioner

## 2022-01-05 VITALS — BP 135/70 | HR 60 | Temp 98.3°F | Resp 16 | Ht 68.0 in | Wt 206.4 lb

## 2022-01-05 DIAGNOSIS — N1831 Chronic kidney disease, stage 3a: Secondary | ICD-10-CM | POA: Diagnosis not present

## 2022-01-05 DIAGNOSIS — I77819 Aortic ectasia, unspecified site: Secondary | ICD-10-CM

## 2022-01-05 DIAGNOSIS — E875 Hyperkalemia: Secondary | ICD-10-CM

## 2022-01-05 NOTE — Progress Notes (Signed)
Danny [name], Danny [name] [id number]  Internal MEDICINE  Office Visit Note  Patient Name: Danny [name]  [id number]  [id number]  Date of Service: 01/05/2022  Chief Complaint  Patient presents with   Follow-up   Results    HPI Danny [name] presents for a follow-up visit to discuss echocardiogram and lab results.  He had an echocardiogram done in October 2021.  The results showed mild aortic sclerosis and aortic regurgitation, aortic valve sclerosis, mild dilated ascending aorta, mild to moderate tricuspid regurgitation, and borderline pulmonary arterial hypertension. His recent repeat echocardiogram showed no significant change when compared to echo from 2021.  -- He had his labs drawn in March this year.  His PSA is 3.5 which is improved from his previous level of 4.5 in 2021. --His potassium level is slightly elevated at 5.3 with no previous bleeding noted elevations.  Discussed with patient about avoiding foods high in potassium.  We can recheck this level in a couple of months.  His glucose level was normal his other electrolytes were also normal his creatinine is elevated at 1.26 and has been pretty consistent for the past few years his GFR is slightly decreased at 54.  His liver enzymes and liver function are all normal except for an elevated total bilirubin of 1.7 which is a change from the past few years. --CBC is normal --Thyroid levels are normal --Cholesterol levels are within normal limits.  His LDL improved from a slightly elevated level of 101 in 20 21-83 this year.  His LDL/HDL ratio was 1.9 which is less than average risk of developing cardiovascular disease.     Current Medication: Outpatient Encounter Medications as of 01/05/2022  Medication Sig   esomeprazole (NEXIUM) 40 MG capsule TAKE 1 CAPSULE BY MOUTH EVERY DAY   furosemide (LASIX) 20 MG tablet Take 1 tablet by mouth daily for 3 days then daily as needed   metoprolol succinate (TOPROL-XL) 25  MG 24 hr tablet Take 1 tablet (25 mg total) by mouth daily.   pravastatin (PRAVACHOL) 40 MG tablet TAKE 1 TABLET BY MOUTH EVERYDAY AT BEDTIME   No facility-administered encounter medications on file as of 01/05/2022.    Surgical History: Past Surgical History:  Procedure Laterality Date   CATARACT EXTRACTION, BILATERAL     CHOLECYSTECTOMY  2003   FINGER AMPUTATION     partial - pinkie on R hand    REPLACEMENT TOTAL KNEE Right    TOTAL KNEE ARTHROPLASTY Left 04/27/2016   Procedure: LEFT TOTAL KNEE ARTHROPLASTY;  Surgeon: Elsie Saas, MD;  Location: [facility];  Service: Orthopedics;  Laterality: Left;   TOTAL KNEE ARTHROPLASTY Right 04/04/2018   Procedure: TOTAL KNEE ARTHROPLASTY;  Surgeon: Elsie Saas, MD;  Location: [facility];  Service: Orthopedics;  Laterality: Right;    Medical History: Past Medical History:  Diagnosis Date   Essential hypertension    a. Dr. Humphrey Rolls in [place] managing.  Previously on antihypertensive, which was later d/c'd.   GERD (gastroesophageal reflux disease)    Hyperlipidemia    Primary localized osteoarthritis of left knee    patient had left total knee 04/27/16   Primary localized osteoarthritis of right knee    PUD (peptic ulcer disease)    a. Remote h/o coffee grounds emesis followed by GI eval.  On Nexium since.   S/P total knee replacement, left     Family History: Family History  Problem Relation Age of Onset   Parkinson's disease Mother  died @ 74   Pneumonia Father        died in his 52's   Heart disease Sister        1/2 sister - currently 48.   Hypertension Sister    Stroke Sister     Social History   Socioeconomic History   Marital status: Married    Spouse name: Not on file   Number of children: Not on file   Years of education: Not on file   Highest education level: Not on file  Occupational History    Comment: Previously worked in [employer] in [employer]  Tobacco Use   Smoking status: Never   Smokeless tobacco:  Never  Vaping Use   Vaping Use: Never used  Substance and Sexual Activity   Alcohol use: No   Drug use: No   Sexual activity: Not on file  Other Topics Concern   Not on file  Social History Narrative   Married takes care of wife who has Parkinson's and Early Alzheimers.  Fairly active.   Social Determinants of Health   Financial Resource Strain: Not on file  Food Insecurity: Not on file  Transportation Needs: Not on file  Physical Activity: Not on file  Stress: Not on file  Social Connections: Not on file  Intimate Partner Violence: Not on file      Review of Systems  Constitutional:  Negative for chills, fatigue and unexpected weight change.  HENT:  Negative for congestion, rhinorrhea, sneezing and sore throat.   Eyes:  Negative for redness.  Respiratory:  Negative for cough, chest tightness and shortness of breath.   Cardiovascular:  Negative for chest pain and palpitations.  Gastrointestinal:  Negative for abdominal pain, constipation, diarrhea, nausea and vomiting.  Genitourinary:  Negative for dysuria and frequency.  Musculoskeletal:  Negative for arthralgias, back pain, joint swelling and neck pain.  Skin:  Negative for rash.  Neurological: Negative.  Negative for tremors and numbness.  Hematological:  Negative for adenopathy. Does not bruise/bleed easily.  Psychiatric/Behavioral:  Negative for behavioral problems (Depression), sleep disturbance and suicidal ideas. The patient is not nervous/anxious.     Vital Signs: BP 135/70   Pulse 60   Temp 98.3 F (36.8 C)   Resp 16   Ht 5\' 8"  (1.727 m)   Wt 206 lb 6.4 oz (93.6 kg)   SpO2 98%   BMI 31.38 kg/m    Physical Exam Constitutional:      General: He is not in acute distress.    Appearance: Normal appearance. He is well-developed. He is obese. He is not ill-appearing or diaphoretic.  HENT:     Head: Normocephalic and atraumatic.  Eyes:     Pupils: Pupils are equal, round, and reactive to light.  Neck:      Thyroid: No thyromegaly.     Vascular: No JVD.     Trachea: No tracheal deviation.  Cardiovascular:     Rate and Rhythm: Normal rate and regular rhythm.  Pulmonary:     Effort: Pulmonary effort is normal. No respiratory distress.  Musculoskeletal:     Cervical back: Normal range of motion and neck supple.  Neurological:     Mental Status: He is alert and oriented to person, place, and time.     Motor: No abnormal muscle tone.     Gait: Gait normal.     Deep Tendon Reflexes: Reflexes are normal and symmetric.  Psychiatric:        Mood and Affect: Mood  normal.        Behavior: Behavior normal.        Assessment/Plan: 1. Aortic ectasia (HCC) Continue annual echocardiogram to monitor aorta size along with other abnormalities seen on previous echocardiograms  2. Stage 3a chronic kidney disease (HCC) Current GFR is 54, creatinine is stable at 1.26.  Patient is 88 years old, discuss obtaining baseline renal ultrasound at his annual wellness visit in October.  3. Hyperbilirubinemia Total bilirubin of 1.7, has had slightly elevated bilirubin level over the past couple of years at 1.4 and 1.3 respectively.  Consider discussing obtaining baseline liver ultrasound at wellness visit in October this year.  4. Hyperkalemia Slightly elevated potassium level of 5.3 on metabolic panel. discussed current diet and what foods are high in potassium that he should avoid or at least limit intake of   General Counseling: Levin verbalizes understanding of the findings of todays visit and agrees with plan of treatment. I have discussed any further diagnostic evaluation that may be needed or ordered today. We also reviewed his medications today. he has been encouraged to call the office with any questions or concerns that should arise related to todays visit.    No orders of the defined types were placed in this encounter.   No orders of the defined types were placed in this encounter.   Return  in 5 months (on 06/11/2022) for previously scheduled, CPE, Naiya Corral PCP.   Total time spent:30 Minutes Time spent includes review of chart, medications, test results, and follow up plan with the patient.   [place] Controlled Substance Database was reviewed by me.  This patient was seen by Jonetta Osgood, FNP-C in collaboration with Dr. Clayborn Bigness as a part of collaborative care agreement.   Khianna Blazina R. Valetta Fuller, MSN, FNP-C Internal medicine

## 2022-01-18 ENCOUNTER — Ambulatory Visit
Admission: EM | Admit: 2022-01-18 | Discharge: 2022-01-18 | Disposition: A | Discharge status: Home / Self Care | Payer: Medicare Other | Attending: Internal Medicine | Admitting: Internal Medicine

## 2022-01-18 DIAGNOSIS — L237 Allergic contact dermatitis due to plants, except food: Secondary | ICD-10-CM | POA: Diagnosis not present

## 2022-01-18 MED ORDER — METHYLPREDNISOLONE 4 MG PO TBPK: ORAL_TABLET | ORAL | 0 refills | Status: DC

## 2022-01-18 NOTE — ED Provider Notes (Signed)
?[facility] ? ? ? ?CSN: [id number] ?Arrival date & time: 01/18/22  0806 ? ? ?  ? ?History   ?Chief Complaint ?Chief Complaint  ?Patient presents with  ? Poison Ivy  ? ? ?HPI ?Danny [name] is a 88 y.o. male who presents with swelling and rash on his L eye lids and L forearm x 2 days. He was cleaning out the yard and saw it among the bushes he had picked up. Denies L eye tearing, photophobia or eye pain. The rash is itchy and not painful ? ? ? ?Past Medical History:  ?Diagnosis Date  ? Essential hypertension   ? a. Dr. Welton Flakes in [place] managing.  Previously on antihypertensive, which was later d/c'd.  ? GERD (gastroesophageal reflux disease)   ? Hyperlipidemia   ? Primary localized osteoarthritis of left knee   ? patient had left total knee 04/27/16  ? Primary localized osteoarthritis of right knee   ? PUD (peptic ulcer disease)   ? a. Remote h/o coffee grounds emesis followed by GI eval.  On Nexium since.  ? S/P total knee replacement, left   ? ? ?Patient Active Problem List  ? Diagnosis Date Noted  ? Aortic ectasia (HCC) 06/24/2020  ? Systolic murmur 06/03/2020  ? Encounter for general adult medical examination with abnormal findings 06/04/2019  ? Dysuria 06/04/2019  ? Urinary tract infection with hematuria 09/22/2018  ? Left low back pain 09/22/2018  ? Needs flu shot 05/30/2018  ? S/P total knee replacement, right 04/04/2018  ? Primary localized osteoarthrosis of the knee, right 04/04/2018  ? S/P total knee replacement, left   ? Primary localized osteoarthritis of right knee   ? Pre-op exam 02/08/2018  ? Screening for prostate cancer 02/08/2018  ? Essential hypertension   ? Gastroesophageal reflux disease without esophagitis   ? Mixed hyperlipidemia   ? Primary osteoarthritis of right knee   ? ? ?Past Surgical History:  ?Procedure Laterality Date  ? CATARACT EXTRACTION, BILATERAL    ? CHOLECYSTECTOMY  2003  ? FINGER AMPUTATION    ? partial - pinkie on R hand   ? REPLACEMENT TOTAL KNEE Right   ? TOTAL  KNEE ARTHROPLASTY Left 04/27/2016  ? Procedure: LEFT TOTAL KNEE ARTHROPLASTY;  Surgeon: Salvatore Marvel, MD;  Location: [facility] OR;  Service: Orthopedics;  Laterality: Left;  ? TOTAL KNEE ARTHROPLASTY Right 04/04/2018  ? Procedure: TOTAL KNEE ARTHROPLASTY;  Surgeon: Salvatore Marvel, MD;  Location: [facility] OR;  Service: Orthopedics;  Laterality: Right;  ? ? ? ? ? ?Home Medications   ? ?Prior to Admission medications   ?Medication Sig Start Date End Date Taking? Authorizing Provider  ?methylPREDNISolone (MEDROL DOSEPAK) 4 MG TBPK tablet Take as directed 01/18/22  Yes Rodriguez-Southworth, Nettie Elm, PA-C  ?esomeprazole (NEXIUM) 40 MG capsule TAKE 1 CAPSULE BY MOUTH EVERY DAY 06/29/21   Sallyanne Kuster, NP  ?furosemide (LASIX) 20 MG tablet Take 1 tablet by mouth daily for 3 days then daily as needed 12/04/21   Carlean Jews, PA-C  ?metoprolol succinate (TOPROL-XL) 25 MG 24 hr tablet Take 1 tablet (25 mg total) by mouth daily. 06/05/21   Sallyanne Kuster, NP  ?pravastatin (PRAVACHOL) 40 MG tablet TAKE 1 TABLET BY MOUTH EVERYDAY AT BEDTIME 06/05/21   Sallyanne Kuster, NP  ? ? ?Family History ?Family History  ?Problem Relation Age of Onset  ? Parkinson's disease Mother   ?     died @ 11  ? Pneumonia Father   ?     died  in his 7620's  ? Heart disease Sister   ?     1/2 sister - currently 3064.  ? Hypertension Sister   ? Stroke Sister   ? ? ?Social History ?Social History  ? ?Tobacco Use  ? Smoking status: Never  ? Smokeless tobacco: Never  ?Vaping Use  ? Vaping Use: Never used  ?Substance Use Topics  ? Alcohol use: No  ? Drug use: No  ? ? ? ?Allergies   ?No known allergies ? ? ?Review of Systems ?Review of Systems  ?Eyes:  Negative for photophobia, pain, discharge, redness and visual disturbance.  ?Skin:  Positive for rash.  ? ? ?Physical Exam ?Triage Vital Signs ?ED Triage Vitals  ?Enc Vitals Group  ?   BP 01/18/22 0822 (!) 155/84  ?   Pulse Rate 01/18/22 0822 67  ?   Resp 01/18/22 0822 16  ?   Temp 01/18/22 0822 98.2 ?F (36.8 ?C)  ?    Temp Source 01/18/22 0822 Temporal  ?   SpO2 01/18/22 0822 98 %  ?   Weight --   ?   Height --   ?   Head Circumference --   ?   Peak Flow --   ?   Pain Score 01/18/22 0823 0  ?   Pain Loc --   ?   Pain Edu? --   ?   Excl. in GC? --   ? ?No data found. ? ?Updated Vital Signs ?BP (!) 155/84 (BP Location: Left Arm)   Pulse 67   Temp 98.2 ?F (36.8 ?C) (Temporal)   Resp 16   SpO2 98%  ? ?Visual Acuity ?Right Eye Distance:   ?Left Eye Distance:   ?Bilateral Distance:   ? ?Right Eye Near:   ?Left Eye Near:    ?Bilateral Near:    ? ?Physical Exam ?Constitutional:   ?   General: He is not in acute distress. ?   Appearance: He is not toxic-appearing.  ?HENT:  ?   Right Ear: External ear normal.  ?   Left Ear: External ear normal.  ?   Nose:  ?   Comments: Has rash on L nose and tip ?Eyes:  ?   Conjunctiva/sclera: Conjunctivae normal.  ?Pulmonary:  ?   Effort: Pulmonary effort is normal.  ?Musculoskeletal:     ?   General: Normal range of motion.  ?Skin: ?   General: Skin is warm and dry.  ?   Findings: Rash present.  ?   Comments: Has erythema and swelling of upper and lower lids with few vesicles on R medial lid, and a couple of L forearm. None seen anywhere else  ?Neurological:  ?   Mental Status: He is alert and oriented to person, place, and time.  ?   Gait: Gait normal.  ?Psychiatric:     ?   Mood and Affect: Mood normal.     ?   Behavior: Behavior normal.     ?   Thought Content: Thought content normal.     ?   Judgment: Judgment normal.  ? ? ? ?UC Treatments / Results  ?Labs ?(all labs ordered are listed, but only abnormal results are displayed) ?Labs Reviewed - No data to display ? ?EKG ? ? ?Radiology ?No results found. ? ?Procedures ?Procedures (including critical care time) ? ?Medications Ordered in UC ?Medications - No data to display ? ?Initial Impression / Assessment and Plan / UC Course  ?I have reviewed the triage  vital signs and the nursing notes. ?Poison IV ? ?I placed pt on Medrol dose pack to start  today. ? ?Final Clinical Impressions(s) / UC Diagnoses  ? ?Final diagnoses:  ?Poison ivy  ? ? ? ?Discharge Instructions   ? ?  ?Wash your skin with TECNU which will wash out the oils from the plant off your skin. Do this twice a day for 4-5 days or the rash starts drying out.  ? ? ? ? ?ED Prescriptions   ? ? Medication Sig Dispense Auth. Provider  ? methylPREDNISolone (MEDROL DOSEPAK) 4 MG TBPK tablet Take as directed 21 tablet Rodriguez-Southworth, Nettie Elm, PA-C  ? ?  ? ?PDMP not reviewed this encounter. ?  Garey Ham, PA-C ?01/18/22 0355 ? ?

## 2022-01-18 NOTE — ED Triage Notes (Addendum)
Patient presents to Urgent Care with complaints of a poison ivy rash that he developed on Friday. Rash located on left eye and left lower arm. Treating rash with calamine lotion.  ?  ?

## 2022-01-18 NOTE — Discharge Instructions (Signed)
Wash your skin with TECNU which will wash out the oils from the plant off your skin. Do this twice a day for 4-5 days or the rash starts drying out.  ?

## 2022-02-15 ENCOUNTER — Encounter: Payer: Self-pay | Admitting: Nurse Practitioner

## 2022-03-20 ENCOUNTER — Other Ambulatory Visit: Payer: Self-pay | Admitting: Nurse Practitioner

## 2022-03-20 DIAGNOSIS — K219 Gastro-esophageal reflux disease without esophagitis: Secondary | ICD-10-CM

## 2022-05-28 ENCOUNTER — Other Ambulatory Visit: Payer: Self-pay | Admitting: Nurse Practitioner

## 2022-05-28 DIAGNOSIS — I10 Essential (primary) hypertension: Secondary | ICD-10-CM

## 2022-06-05 ENCOUNTER — Telehealth: Payer: Self-pay | Admitting: Nurse Practitioner

## 2022-06-05 ENCOUNTER — Other Ambulatory Visit: Payer: Self-pay | Admitting: Nurse Practitioner

## 2022-06-05 DIAGNOSIS — E782 Mixed hyperlipidemia: Secondary | ICD-10-CM

## 2022-06-05 NOTE — Telephone Encounter (Signed)
Left vm and sent mychart message to confirm 06/11/22 appointment-Toni 

## 2022-06-11 ENCOUNTER — Encounter: Payer: Self-pay | Admitting: Nurse Practitioner

## 2022-06-11 ENCOUNTER — Ambulatory Visit (INDEPENDENT_AMBULATORY_CARE_PROVIDER_SITE_OTHER): Payer: Medicare Other | Admitting: Nurse Practitioner

## 2022-06-11 VITALS — BP 135/75 | HR 82 | Temp 96.4°F | Resp 16 | Ht 68.0 in | Wt 203.2 lb

## 2022-06-11 DIAGNOSIS — R300 Dysuria: Secondary | ICD-10-CM

## 2022-06-11 DIAGNOSIS — Z23 Encounter for immunization: Secondary | ICD-10-CM | POA: Diagnosis not present

## 2022-06-11 DIAGNOSIS — M7989 Other specified soft tissue disorders: Secondary | ICD-10-CM

## 2022-06-11 DIAGNOSIS — Z0001 Encounter for general adult medical examination with abnormal findings: Secondary | ICD-10-CM | POA: Diagnosis not present

## 2022-06-11 DIAGNOSIS — Z760 Encounter for issue of repeat prescription: Secondary | ICD-10-CM

## 2022-06-11 DIAGNOSIS — K219 Gastro-esophageal reflux disease without esophagitis: Secondary | ICD-10-CM

## 2022-06-11 MED ORDER — ESOMEPRAZOLE MAGNESIUM 40 MG PO CPDR: DELAYED_RELEASE_CAPSULE | ORAL | 3 refills | Status: DC

## 2022-06-11 MED ORDER — FUROSEMIDE 20 MG PO TABS: ORAL_TABLET | ORAL | 3 refills | Status: DC

## 2022-06-11 NOTE — Progress Notes (Signed)
[facility] [address] [place], [place] [address]  Internal MEDICINE  Office Visit Note  Patient Name: Danny [name]  [id number]  [id number]  Date of Service: 06/11/2022  Chief Complaint  Patient presents with   Medicare Wellness   Gastroesophageal Reflux   Hyperlipidemia   Hypertension    HPI Danny [name] presents for an annual well visit and physical exam.  Well appearing 88 year old male with hypertension, GERD, osteoarthritis of right knee and high cholesterol.  --no preventive screenings due  --routine lab draw deferred for now, consider on as needed basis, most recent labs were march this year --lost 3 lbs since last office visit.  --Still driving motor vehicle. --Mows his lawn with a push mower for exercise, no changes in eating habits or lifestyle -requesting flu shot and medication refills today.  --BP and lower extremity edema is controlled with current meds.  GERD is well controlled with omeprazole.  No new or worsening pain and no other concerns.      Current Medication: Outpatient Encounter Medications as of 06/11/2022  Medication Sig   methylPREDNISolone (MEDROL DOSEPAK) 4 MG TBPK tablet Take as directed   metoprolol succinate (TOPROL-XL) 25 MG 24 hr tablet TAKE 1 TABLET (25 MG TOTAL) BY MOUTH DAILY.   pravastatin (PRAVACHOL) 40 MG tablet TAKE 1 TABLET BY MOUTH EVERYDAY AT BEDTIME   [DISCONTINUED] esomeprazole (NEXIUM) 40 MG capsule TAKE 1 CAPSULE BY MOUTH EVERY DAY   [DISCONTINUED] furosemide (LASIX) 20 MG tablet Take 1 tablet by mouth daily for 3 days then daily as needed   esomeprazole (NEXIUM) 40 MG capsule TAKE 1 CAPSULE BY MOUTH EVERY DAY   furosemide (LASIX) 20 MG tablet Take 1 tablet by mouth daily as needed for swelling   No facility-administered encounter medications on file as of 06/11/2022.    Surgical History: Past Surgical History:  Procedure Laterality Date   CATARACT EXTRACTION, BILATERAL     CHOLECYSTECTOMY  2003   FINGER  AMPUTATION     partial - pinkie on R hand    REPLACEMENT TOTAL KNEE Right    TOTAL KNEE ARTHROPLASTY Left 04/27/2016   Procedure: LEFT TOTAL KNEE ARTHROPLASTY;  Surgeon: Salvatore Marvel, MD;  Location: [facility] OR;  Service: Orthopedics;  Laterality: Left;   TOTAL KNEE ARTHROPLASTY Right 04/04/2018   Procedure: TOTAL KNEE ARTHROPLASTY;  Surgeon: Salvatore Marvel, MD;  Location: [facility] OR;  Service: Orthopedics;  Laterality: Right;    Medical History: Past Medical History:  Diagnosis Date   Essential hypertension    a. Dr. Welton Flakes in [place] managing.  Previously on antihypertensive, which was later d/c'd.   GERD (gastroesophageal reflux disease)    Hyperlipidemia    Primary localized osteoarthritis of left knee    patient had left total knee 04/27/16   Primary localized osteoarthritis of right knee    PUD (peptic ulcer disease)    a. Remote h/o coffee grounds emesis followed by GI eval.  On Nexium since.   S/P total knee replacement, left     Family History: Family History  Problem Relation Age of Onset   Parkinson's disease Mother        died @ 48   Pneumonia Father        died in his 40's   Heart disease Sister        1/2 sister - currently 80.   Hypertension Sister    Stroke Sister     Social History   Socioeconomic History   Marital status: Married  Spouse name: Not on file   Number of children: Not on file   Years of education: Not on file   Highest education level: Not on file  Occupational History    Comment: Previously worked in [employer] in [employer]  Tobacco Use   Smoking status: Never   Smokeless tobacco: Never  Vaping Use   Vaping Use: Never used  Substance and Sexual Activity   Alcohol use: No   Drug use: No   Sexual activity: Not on file  Other Topics Concern   Not on file  Social History Narrative   Married takes care of wife who has Parkinson's and Early Alzheimers.  Fairly active.   Social Determinants of Health   Financial Resource Strain: Not on  file  Food Insecurity: Not on file  Transportation Needs: Not on file  Physical Activity: Not on file  Stress: Not on file  Social Connections: Not on file  Intimate Partner Violence: Not on file      Review of Systems  Constitutional:  Negative for activity change, appetite change, chills, fatigue, fever and unexpected weight change.  HENT: Negative.  Negative for congestion, ear pain, rhinorrhea, sore throat and trouble swallowing.   Eyes: Negative.   Respiratory: Negative.  Negative for cough, chest tightness, shortness of breath and wheezing.   Cardiovascular: Negative.  Negative for chest pain.  Gastrointestinal: Negative.  Negative for abdominal pain, blood in stool, constipation, diarrhea, nausea and vomiting.  Endocrine: Negative.   Genitourinary: Negative.  Negative for difficulty urinating, dysuria, frequency, hematuria and urgency.  Musculoskeletal: Negative.  Negative for arthralgias, back pain, joint swelling, myalgias and neck pain.  Skin: Negative.  Negative for rash and wound.  Allergic/Immunologic: Negative.  Negative for immunocompromised state.  Neurological: Negative.  Negative for dizziness, seizures, numbness and headaches.  Hematological: Negative.   Psychiatric/Behavioral: Negative.  Negative for behavioral problems, self-injury and suicidal ideas. The patient is not nervous/anxious.     Vital Signs: BP 135/75   Pulse 82   Temp (!) 96.4 F (35.8 C)   Resp 16   Ht 5\' 8"  (1.727 m)   Wt 203 lb 3.2 oz (92.2 kg)   SpO2 99%   BMI 30.90 kg/m    Physical Exam Vitals reviewed.  Constitutional:      General: He is awake. He is not in acute distress.    Appearance: Normal appearance. He is well-developed. He is obese. He is not ill-appearing or diaphoretic.  HENT:     Head: Normocephalic and atraumatic.     Right Ear: Tympanic membrane, ear canal and external ear normal.     Left Ear: Tympanic membrane, ear canal and external ear normal.     Nose: Nose  normal.     Mouth/Throat:     Lips: Pink.     Mouth: Mucous membranes are moist.     Pharynx: Oropharynx is clear. Uvula midline. No oropharyngeal exudate.  Eyes:     General: Lids are normal. Vision grossly intact. Gaze aligned appropriately. No scleral icterus.       Right eye: No discharge.        Left eye: No discharge.     Extraocular Movements: Extraocular movements intact.     Conjunctiva/sclera: Conjunctivae normal.     Pupils: Pupils are equal, round, and reactive to light.     Funduscopic exam:    Right eye: Red reflex present.        Left eye: Red reflex present. Neck:  Thyroid: No thyromegaly.     Vascular: No JVD.     Trachea: Trachea and phonation normal. No tracheal deviation.  Cardiovascular:     Rate and Rhythm: Normal rate and regular rhythm.     Pulses: Normal pulses.     Heart sounds: Normal heart sounds, S1 normal and S2 normal. No murmur heard.    No friction rub. No gallop.  Pulmonary:     Effort: Pulmonary effort is normal. No accessory muscle usage or respiratory distress.     Breath sounds: Normal breath sounds and air entry. No stridor. No wheezing or rales.  Chest:     Chest wall: No tenderness.  Abdominal:     General: Bowel sounds are normal. There is no distension.     Palpations: Abdomen is soft. There is no shifting dullness, fluid wave, mass or pulsatile mass.     Tenderness: There is no abdominal tenderness. There is no guarding or rebound.  Musculoskeletal:        General: No tenderness or deformity. Normal range of motion.     Cervical back: Normal range of motion and neck supple.     Right lower leg: 1+ Edema present.     Left lower leg: 1+ Edema present.  Lymphadenopathy:     Cervical: No cervical adenopathy.  Skin:    General: Skin is warm and dry.     Capillary Refill: Capillary refill takes less than 2 seconds.     Coloration: Skin is not pale.     Findings: No erythema or rash.  Neurological:     Mental Status: He is alert  and oriented to person, place, and time.     Cranial Nerves: No cranial nerve deficit.     Motor: No abnormal muscle tone.     Coordination: Coordination normal.     Gait: Gait normal.     Deep Tendon Reflexes: Reflexes are normal and symmetric.  Psychiatric:        Mood and Affect: Mood and affect normal.        Behavior: Behavior normal. Behavior is cooperative.        Thought Content: Thought content normal.        Judgment: Judgment normal.        Assessment/Plan: 1. Encounter for general adult medical examination with abnormal findings Age-appropriate preventive screenings and vaccinations discussed, annual physical exam completed. Routine labs for health maintenance deferred due to age, will consider labs on as needed basis. PHM updated.   2. Dysuria Routine urinalysis done - UA/M w/rflx Culture, Routine - Microscopic Examination - Urine Culture, Reflex  3. Needs flu shot Administered in office today - Flu Vaccine MDCK QUAD PF  4. Medication refill - furosemide (LASIX) 20 MG tablet; Take 1 tablet by mouth daily as needed for swelling  Dispense: 30 tablet; Refill: 3 - esomeprazole (NEXIUM) 40 MG capsule; TAKE 1 CAPSULE BY MOUTH EVERY DAY  Dispense: 90 capsule; Refill: 3      General Counseling: Ajit verbalizes understanding of the findings of todays visit and agrees with plan of treatment. I have discussed any further diagnostic evaluation that may be needed or ordered today. We also reviewed his medications today. he has been encouraged to call the office with any questions or concerns that should arise related to todays visit.    Orders Placed This Encounter  Procedures   Flu Vaccine MDCK QUAD PF   UA/M w/rflx Culture, Routine    Meds ordered this encounter  Medications  furosemide (LASIX) 20 MG tablet    Sig: Take 1 tablet by mouth daily as needed for swelling    Dispense:  30 tablet    Refill:  3   esomeprazole (NEXIUM) 40 MG capsule    Sig: TAKE 1  CAPSULE BY MOUTH EVERY DAY    Dispense:  90 capsule    Refill:  3    For future refills    Return in about 6 months (around 12/11/2022) for F/U, Jadarion Halbig PCP.   Total time spent:30 Minutes Time spent includes review of chart, medications, test results, and follow up plan with the patient.   [place] Controlled Substance Database was reviewed by me.  This patient was seen by Sallyanne Kuster, FNP-C in collaboration with Dr. Beverely Risen as a part of collaborative care agreement.  Rukiya Hodgkins R. Tedd Sias, MSN, FNP-C Internal medicine

## 2022-06-15 ENCOUNTER — Encounter: Payer: Self-pay | Admitting: Nurse Practitioner

## 2022-06-15 LAB — UA/M W/RFLX CULTURE, ROUTINE
Bilirubin, UA: NEGATIVE
Glucose, UA: NEGATIVE
Nitrite, UA: NEGATIVE
Protein,UA: NEGATIVE
RBC, UA: NEGATIVE
Specific Gravity, UA: 1.021 (ref 1.005–1.030)
Urobilinogen, Ur: 1 mg/dL (ref 0.2–1.0)
pH, UA: 5.5 (ref 5.0–7.5)

## 2022-06-15 LAB — MICROSCOPIC EXAMINATION: Casts: NONE SEEN /lpf

## 2022-06-15 LAB — URINE CULTURE, REFLEX

## 2022-06-16 ENCOUNTER — Other Ambulatory Visit: Payer: Self-pay

## 2022-06-16 ENCOUNTER — Telehealth: Payer: Self-pay

## 2022-06-16 MED ORDER — NITROFURANTOIN MONOHYD MACRO 100 MG PO CAPS: 100.0000 mg | ORAL_CAPSULE | Freq: Two times a day (BID) | ORAL | 0 refills | Status: DC

## 2022-06-16 NOTE — Telephone Encounter (Signed)
-----   Message from Jonetta Osgood, NP sent at 06/16/2022  2:53 PM EDT ----- Macrobid 100 mg PO twice daily x 5 days.  Most likely colonization or contamination of the specimen due to the specific bacteria detected but will send antibiotic mentioned above due to age and increased risk of severe complications from an untreated UTI or bladder infection

## 2022-06-16 NOTE — Progress Notes (Signed)
Macrobid 100 mg PO twice daily x 5 days.  Most likely colonization or contamination of the specimen due to the specific bacteria detected but will send antibiotic mentioned above due to age and increased risk of severe complications from an untreated UTI or bladder infection

## 2022-06-16 NOTE — Telephone Encounter (Signed)
Pt daughter notified that send macrobid

## 2022-07-20 ENCOUNTER — Encounter: Payer: Self-pay | Admitting: Nurse Practitioner

## 2022-12-10 ENCOUNTER — Ambulatory Visit: Payer: Medicare Other | Admitting: Physician Assistant

## 2022-12-10 ENCOUNTER — Encounter: Payer: Self-pay | Admitting: Physician Assistant

## 2022-12-10 VITALS — BP 132/78 | HR 69 | Temp 98.4°F | Resp 16 | Ht 68.0 in | Wt 208.4 lb

## 2022-12-10 DIAGNOSIS — I10 Essential (primary) hypertension: Secondary | ICD-10-CM

## 2022-12-10 NOTE — Progress Notes (Signed)
[facility] [place], [place] [address]  Internal MEDICINE  Office Visit Note  Patient Name: Danny [name]  [id number]  [id number]  Date of Service: 12/10/2022  Chief Complaint  Patient presents with   Follow-up   Gastroesophageal Reflux   Hypertension   Hyperlipidemia   Quality Metric Gaps    Pneumonia and Shingles Vaccines    HPI Pt is here for routine follow up -Sleeping well and eating well -He is staying active and continues to use a push lawn mower -He has no complaints today -He continues to take lasix only as needed for ankle swelling -He takes his metoprolol in evening, reports his BP is always high when he comes into office initially and improved significantly on manual recheck  Current Medication: Outpatient Encounter Medications as of 12/10/2022  Medication Sig   esomeprazole (NEXIUM) 40 MG capsule TAKE 1 CAPSULE BY MOUTH EVERY DAY   furosemide (LASIX) 20 MG tablet Take 1 tablet by mouth daily as needed for swelling   metoprolol succinate (TOPROL-XL) 25 MG 24 hr tablet TAKE 1 TABLET (25 MG TOTAL) BY MOUTH DAILY.   pravastatin (PRAVACHOL) 40 MG tablet TAKE 1 TABLET BY MOUTH EVERYDAY AT BEDTIME   [DISCONTINUED] methylPREDNISolone (MEDROL DOSEPAK) 4 MG TBPK tablet Take as directed   [DISCONTINUED] nitrofurantoin, macrocrystal-monohydrate, (MACROBID) 100 MG capsule Take 1 capsule (100 mg total) by mouth 2 (two) times daily.   No facility-administered encounter medications on file as of 12/10/2022.    Surgical History: Past Surgical History:  Procedure Laterality Date   CATARACT EXTRACTION, BILATERAL     CHOLECYSTECTOMY  2003   FINGER AMPUTATION     partial - pinkie on R hand    REPLACEMENT TOTAL KNEE Right    TOTAL KNEE ARTHROPLASTY Left 04/27/2016   Procedure: LEFT TOTAL KNEE ARTHROPLASTY;  Surgeon: Elsie Saas, MD;  Location: [place];  Service: Orthopedics;  Laterality: Left;   TOTAL KNEE ARTHROPLASTY Right 04/04/2018   Procedure:  TOTAL KNEE ARTHROPLASTY;  Surgeon: Elsie Saas, MD;  Location: [place];  Service: Orthopedics;  Laterality: Right;    Medical History: Past Medical History:  Diagnosis Date   Essential hypertension    a. Dr. Humphrey Rolls in [place] managing.  Previously on antihypertensive, which was later d/c'd.   GERD (gastroesophageal reflux disease)    Hyperlipidemia    Primary localized osteoarthritis of left knee    patient had left total knee 04/27/16   Primary localized osteoarthritis of right knee    PUD (peptic ulcer disease)    a. Remote h/o coffee grounds emesis followed by GI eval.  On Nexium since.   S/P total knee replacement, left     Family History: Family History  Problem Relation Age of Onset   Parkinson's disease Mother        died @ 1   Pneumonia Father        died in his 58's   Heart disease Sister        1/2 sister - currently 35.   Hypertension Sister    Stroke Sister     Social History   Socioeconomic History   Marital status: Married    Spouse name: Not on file   Number of children: Not on file   Years of education: Not on file   Highest education level: Not on file  Occupational History    Comment: Previously worked in [employer] in [employer]  Tobacco Use   Smoking status: Never   Smokeless tobacco:  Never  Vaping Use   Vaping Use: Never used  Substance and Sexual Activity   Alcohol use: No   Drug use: No   Sexual activity: Not on file  Other Topics Concern   Not on file  Social History Narrative   Married takes care of wife who has Parkinson's and Early Alzheimers.  Fairly active.   Social Determinants of Health   Financial Resource Strain: Not on file  Food Insecurity: Not on file  Transportation Needs: Not on file  Physical Activity: Not on file  Stress: Not on file  Social Connections: Not on file  Intimate Partner Violence: Not on file      Review of Systems  Constitutional:  Negative for chills, fatigue and unexpected weight change.   HENT:  Negative for congestion, rhinorrhea, sneezing and sore throat.   Eyes:  Negative for redness.  Respiratory:  Negative for cough, chest tightness and shortness of breath.   Cardiovascular:  Negative for chest pain and palpitations.  Gastrointestinal:  Negative for abdominal pain, constipation, diarrhea, nausea and vomiting.  Genitourinary:  Negative for dysuria and frequency.  Musculoskeletal:  Negative for arthralgias, back pain, joint swelling and neck pain.  Skin:  Negative for rash.  Neurological: Negative.  Negative for tremors and numbness.  Hematological:  Negative for adenopathy. Does not bruise/bleed easily.  Psychiatric/Behavioral:  Negative for behavioral problems (Depression), sleep disturbance and suicidal ideas. The patient is not nervous/anxious.     Vital Signs: BP 132/78 Comment: 174/101  Pulse 69   Temp 98.4 F (36.9 C)   Resp 16   Ht 5\' 8"  (1.727 m)   Wt 208 lb 6.4 oz (94.5 kg)   SpO2 92%   BMI 31.69 kg/m    Physical Exam Constitutional:      General: He is not in acute distress.    Appearance: Normal appearance. He is well-developed. He is obese. He is not ill-appearing or diaphoretic.  HENT:     Head: Normocephalic and atraumatic.  Eyes:     Pupils: Pupils are equal, round, and reactive to light.  Neck:     Thyroid: No thyromegaly.     Vascular: No JVD.     Trachea: No tracheal deviation.  Cardiovascular:     Rate and Rhythm: Normal rate and regular rhythm.  Pulmonary:     Effort: Pulmonary effort is normal. No respiratory distress.  Musculoskeletal:     Cervical back: Normal range of motion and neck supple.  Neurological:     Mental Status: He is alert and oriented to person, place, and time.     Motor: No abnormal muscle tone.     Gait: Gait normal.     Deep Tendon Reflexes: Reflexes are normal and symmetric.  Psychiatric:        Mood and Affect: Mood normal.        Behavior: Behavior normal.        Assessment/Plan: 1.  Essential hypertension Stable, continue current medication   General Counseling: Lovis verbalizes understanding of the findings of todays visit and agrees with plan of treatment. I have discussed any further diagnostic evaluation that may be needed or ordered today. We also reviewed his medications today. he has been encouraged to call the office with any questions or concerns that should arise related to todays visit.    No orders of the defined types were placed in this encounter.   No orders of the defined types were placed in this encounter.   This patient  was seen by Drema Dallas, PA-C in collaboration with Dr. Clayborn Bigness as a part of collaborative care agreement.   Total time spent:30 Minutes Time spent includes review of chart, medications, test results, and follow up plan with the patient.      Dr Lavera Guise Internal medicine

## 2023-05-21 ENCOUNTER — Other Ambulatory Visit: Payer: Self-pay | Admitting: Internal Medicine

## 2023-05-21 DIAGNOSIS — I10 Essential (primary) hypertension: Secondary | ICD-10-CM

## 2023-05-28 ENCOUNTER — Other Ambulatory Visit: Payer: Self-pay | Admitting: Nurse Practitioner

## 2023-05-28 DIAGNOSIS — E782 Mixed hyperlipidemia: Secondary | ICD-10-CM

## 2023-06-17 ENCOUNTER — Ambulatory Visit: Payer: Medicare Other | Admitting: Nurse Practitioner

## 2023-06-17 ENCOUNTER — Encounter: Payer: Self-pay | Admitting: Nurse Practitioner

## 2023-06-17 VITALS — BP 138/82 | HR 84 | Temp 98.0°F | Resp 16 | Ht 68.0 in | Wt 201.4 lb

## 2023-06-17 DIAGNOSIS — K219 Gastro-esophageal reflux disease without esophagitis: Secondary | ICD-10-CM

## 2023-06-17 DIAGNOSIS — E782 Mixed hyperlipidemia: Secondary | ICD-10-CM | POA: Diagnosis not present

## 2023-06-17 DIAGNOSIS — I10 Essential (primary) hypertension: Secondary | ICD-10-CM

## 2023-06-17 DIAGNOSIS — Z23 Encounter for immunization: Secondary | ICD-10-CM

## 2023-06-17 DIAGNOSIS — Z0000 Encounter for general adult medical examination without abnormal findings: Secondary | ICD-10-CM

## 2023-06-17 MED ORDER — FUROSEMIDE 20 MG PO TABS: ORAL_TABLET | ORAL | Status: AC

## 2023-06-17 MED ORDER — ESOMEPRAZOLE MAGNESIUM 40 MG PO CPDR: DELAYED_RELEASE_CAPSULE | ORAL | 3 refills | Status: DC

## 2023-06-17 NOTE — Progress Notes (Signed)
[facility] [address] [place], [place] [address] [phone]  Patient Name: Danny [name] DOB: Feb 20, 1930 MRN: [id number]  Date of Service: 06/17/23   Medicare Annual Wellness Visit (Subsequent)  PCP: Sallyanne Kuster, NP    Subjective:  CC -- Medicare Annual wellness visit.  Additional concerns? no Patient reports no concerns, no changes in home or lifestyle. 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  Labs deferred.  He is a well appearing male with hypertension, GERD, and high cholesterol. He has no outstanding recommended screenings.       Social Determinants of Health: SDOH Screenings   Food Insecurity: No Food Insecurity (06/17/2023)  Housing: Low Risk  (06/17/2023)  Transportation Needs: No Transportation Needs (06/17/2023)  Utilities: Not At Risk (06/17/2023)  Alcohol Screen: Low Risk  (06/17/2023)  Depression (PHQ2-9): Low Risk  (06/17/2023)  Financial Resource Strain: Low Risk  (06/17/2023)  Physical Activity: Insufficiently Active (06/17/2023)  Social Connections: Moderately Isolated (06/17/2023)  Stress: No Stress Concern Present (06/17/2023)  Tobacco Use: Low Risk  (06/17/2023)  Health Literacy: Adequate Health Literacy (06/17/2023)     Where does the patient live? At home alone Does the patient drive? yes, just renewed driver's license again.  Family support: yes, his children  Community support: yes  Spiritual beliefs: christian  Advance Directives?   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    Cancer:  Colorectal >> Colonoscopy: no, aged out Lung >> Tobacco Use: no, does not smoke  Skin >> Suspicious lesions: no   Other: Zoster Vaccine: no  Flu Vaccine: yes, received today  RSV Vaccine: no Pneumonia Vaccine: no, declined        06/17/2023    9:59 AM 06/11/2022   10:01 AM 06/05/2021   10:04 AM  MMSE - Mini Mental State Exam   Orientation to time 5 5 4   Orientation to Place 5 5 5   Registration 3 3 3   Attention/ Calculation 5 5 5   Recall 3 3 3   Language- name 2 objects 2 2 2   Language- repeat 1 1 1   Language- follow 3 step command 3 3 3   Language- read & follow direction 1 1 1   Write a sentence 0 0 0  Copy design 1 1 1   Total score 29 29 28     Functional Status Survey: Is the patient deaf or have difficulty hearing?: Yes Does the patient have difficulty seeing, even when wearing glasses/contacts?: No Does the patient have difficulty concentrating, remembering, or making decisions?: No Does the patient have difficulty walking or climbing stairs?: No Does the patient have difficulty dressing or bathing?: No Does the patient have difficulty doing errands alone such as visiting a doctor's office or shopping?: No      12/04/2021   10:40 AM 01/18/2022    8:10 AM 06/11/2022   10:00 AM 12/10/2022   10:11 AM 06/17/2023    9:58 AM  Fall Risk  Falls in the past year? 0  0 0 0  Was there an injury with Fall?   0  0  Fall Risk Category Calculator   0  0  Fall Risk Category (Retired)   Low    (RETIRED) Patient Fall Risk Level Low fall risk Low fall risk Low fall risk    Patient at Risk for Falls Due to No Fall Risks  No Fall Risks  No Fall Risks  Fall risk Follow up  Falls evaluation completed  Falls evaluation completed  Falls evaluation completed        06/17/2023    9:58 AM  Depression screen PHQ 2/9  Decreased Interest 0  Down, Depressed, Hopeless 0  PHQ - 2 Score 0        ROS   Past Medical History Patient Active Problem List   Diagnosis Date Noted   Aortic ectasia (HCC) 06/24/2020   Systolic murmur 06/03/2020   Encounter for general adult medical examination with abnormal findings 06/04/2019   Dysuria 06/04/2019   Urinary tract infection with hematuria 09/22/2018   Left low back pain 09/22/2018   Needs flu shot 05/30/2018   S/P total knee replacement, right 04/04/2018   Primary localized  osteoarthrosis of the knee, right 04/04/2018   S/P total knee replacement, left    Primary localized osteoarthritis of right knee    Pre-op exam 02/08/2018   Screening for prostate cancer 02/08/2018   Essential hypertension    Gastroesophageal reflux disease without esophagitis    Mixed hyperlipidemia    Primary osteoarthritis of right knee     Medications- reviewed and updated Current Outpatient Medications  Medication Sig Dispense Refill   metoprolol succinate (TOPROL-XL) 25 MG 24 hr tablet TAKE 1 TABLET (25 MG TOTAL) BY MOUTH DAILY. 90 tablet 3   pravastatin (PRAVACHOL) 40 MG tablet TAKE 1 TABLET BY MOUTH EVERYDAY AT BEDTIME 90 tablet 3   esomeprazole (NEXIUM) 40 MG capsule TAKE 1 CAPSULE BY MOUTH EVERY DAY 90 capsule 3   furosemide (LASIX) 20 MG tablet Take 1 tablet by mouth daily as needed for swelling     No current facility-administered medications for this visit.    Objective: BP 138/82 Comment: 180/98  Pulse 84   Temp 98 F (36.7 C)   Resp 16   Ht 5\' 8"  (1.727 m)   Wt 201 lb 6.4 oz (91.4 kg)   SpO2 97%   BMI 30.62 kg/m  Gen: NAD, alert, cooperative with exam CV: RRR, good S1/S2, no murmur Resp: CTABL, no wheezes, non-labored Neuro: Alert and oriented, No gross deficits   Assessment/Plan: 1. Encounter for subsequent annual wellness visit (AWV) in Medicare patient Age-appropriate preventive screenings and vaccinations discussed, annual physical exam completed. Routine labs for health maintenance deferred, will be done on as needed basis. PHM updated.   2. Essential hypertension Stable, continue metoprolol and furosemide as prescribed.  - furosemide (LASIX) 20 MG tablet; Take 1 tablet by mouth daily as needed for swelling  3. Mixed hyperlipidemia Continue pravastatin as prescribed.   4. Gastroesophageal reflux disease without esophagitis Stable, continue esomeprazole as prescribed.  - esomeprazole (NEXIUM) 40 MG capsule; TAKE 1 CAPSULE BY MOUTH EVERY DAY   Dispense: 90 capsule; Refill: 3  5. Needs flu shot Flu vaccine administered in office today.  - Influenza, MDCK, trivalent, PF(Flucelvax egg-free)     Orders Placed This Encounter  Procedures   Influenza, MDCK, trivalent, PF(Flucelvax egg-free)    Meds ordered this encounter  Medications   esomeprazole (NEXIUM) 40 MG capsule    Sig: TAKE 1 CAPSULE BY MOUTH EVERY DAY    Dispense:  90 capsule    Refill:  3    For future refills   furosemide (LASIX) 20 MG tablet    Sig: Take 1 tablet by mouth daily as needed for swelling    Return in about 1 year (around 06/16/2024) for AWV, Zaron Zwiefelhofer PCP and otherwise as needed. .  Total time spent:30 Minutes Time spent includes review  of chart, medications, test results, and follow up plan with the patient.   [place] Controlled Substance Database was reviewed by me.  This patient was seen by Sallyanne Kuster, FNP-C in collaboration with Dr. Beverely Risen as a part of collaborative care agreement.  Mattia Osterman R. Tedd Sias, MSN, FNP-C Internal medicine/Primary Care [facility]

## 2023-08-06 ENCOUNTER — Encounter: Payer: Self-pay | Admitting: Nurse Practitioner

## 2024-04-29 ENCOUNTER — Other Ambulatory Visit: Payer: Self-pay | Admitting: Internal Medicine

## 2024-04-29 DIAGNOSIS — I10 Essential (primary) hypertension: Secondary | ICD-10-CM

## 2024-05-05 ENCOUNTER — Other Ambulatory Visit: Payer: Self-pay | Admitting: Nurse Practitioner

## 2024-05-05 DIAGNOSIS — E782 Mixed hyperlipidemia: Secondary | ICD-10-CM

## 2024-06-10 ENCOUNTER — Other Ambulatory Visit: Payer: Self-pay | Admitting: Nurse Practitioner

## 2024-06-10 DIAGNOSIS — K219 Gastro-esophageal reflux disease without esophagitis: Secondary | ICD-10-CM

## 2024-06-22 ENCOUNTER — Encounter: Payer: Self-pay | Admitting: Internal Medicine

## 2024-06-22 ENCOUNTER — Telehealth: Payer: Self-pay | Admitting: Internal Medicine

## 2024-06-22 ENCOUNTER — Ambulatory Visit (INDEPENDENT_AMBULATORY_CARE_PROVIDER_SITE_OTHER): Payer: Medicare Other | Admitting: Internal Medicine

## 2024-06-22 VITALS — BP 138/88 | HR 60 | Temp 96.6°F | Resp 16 | Ht 68.0 in | Wt 199.4 lb

## 2024-06-22 DIAGNOSIS — N5089 Other specified disorders of the male genital organs: Secondary | ICD-10-CM

## 2024-06-22 DIAGNOSIS — E782 Mixed hyperlipidemia: Secondary | ICD-10-CM

## 2024-06-22 DIAGNOSIS — J208 Acute bronchitis due to other specified organisms: Secondary | ICD-10-CM

## 2024-06-22 DIAGNOSIS — Z23 Encounter for immunization: Secondary | ICD-10-CM | POA: Diagnosis not present

## 2024-06-22 DIAGNOSIS — K219 Gastro-esophageal reflux disease without esophagitis: Secondary | ICD-10-CM | POA: Diagnosis not present

## 2024-06-22 DIAGNOSIS — Z0001 Encounter for general adult medical examination with abnormal findings: Secondary | ICD-10-CM | POA: Diagnosis not present

## 2024-06-22 DIAGNOSIS — I10 Essential (primary) hypertension: Secondary | ICD-10-CM

## 2024-06-22 DIAGNOSIS — Z125 Encounter for screening for malignant neoplasm of prostate: Secondary | ICD-10-CM

## 2024-06-22 MED ORDER — PREDNISONE 10 MG PO TABS: ORAL_TABLET | ORAL | 0 refills | Status: DC

## 2024-06-22 MED ORDER — AZITHROMYCIN 250 MG PO TABS: ORAL_TABLET | ORAL | 0 refills | Status: DC

## 2024-06-22 NOTE — Telephone Encounter (Signed)
 Notified patient of U/S appointment date, arrival time, [place] location -[place]

## 2024-06-22 NOTE — Progress Notes (Signed)
 [facility] [address] [place], [place] [address]  Internal MEDICINE  Office Visit Note  Patient Name: Danny [name]  [id number]  [id number]  Date of Service: 06/22/2024  Chief Complaint  Patient presents with   Gastroesophageal Reflux   Hypertension   Hyperlipidemia   Medicare Wellness     HPI Pt is here for routine health maintenance examination Patient is complaining of cough and congestion mild sore throat has been taking over-the-counter medications with no relief of his symptoms he denies any fever but does feel little tired He is able to live at home, able to mow his yard, has supportive family, he also drives I do not see any memory impairment or cognitive decline Has been taking all his medications Seems to be a little hard of hearing  Current Medication: Outpatient Encounter Medications as of 06/22/2024  Medication Sig   azithromycin (ZITHROMAX) 250 MG tablet Take one tab a day for 10 days for uri   predniSONE (DELTASONE) 10 MG tablet Take one tab 3 x day for 3 days, then take one tab 2 x a day for 3 days and then take one tab a day for 3 days for copd   esomeprazole  (NEXIUM ) 40 MG capsule TAKE 1 CAPSULE BY MOUTH EVERY DAY   furosemide  (LASIX ) 20 MG tablet Take 1 tablet by mouth daily as needed for swelling   metoprolol  succinate (TOPROL -XL) 25 MG 24 hr tablet TAKE 1 TABLET (25 MG TOTAL) BY MOUTH DAILY.   pravastatin  (PRAVACHOL ) 40 MG tablet TAKE 1 TABLET BY MOUTH EVERYDAY AT BEDTIME   No facility-administered encounter medications on file as of 06/22/2024.    Surgical History: Past Surgical History:  Procedure Laterality Date   CATARACT EXTRACTION, BILATERAL     CHOLECYSTECTOMY  2003   FINGER AMPUTATION     partial - pinkie on R hand    REPLACEMENT TOTAL KNEE Right    TOTAL KNEE ARTHROPLASTY Left 04/27/2016   Procedure: LEFT TOTAL KNEE ARTHROPLASTY;  Surgeon: Lamar Millman, MD;  Location: [facility] OR;  Service: Orthopedics;  Laterality: Left;    TOTAL KNEE ARTHROPLASTY Right 04/04/2018   Procedure: TOTAL KNEE ARTHROPLASTY;  Surgeon: Millman Lamar, MD;  Location[facility] OR;  Service: Orthopedics;  Laterality: Right;    Medical History: Past Medical History:  Diagnosis Date   Essential hypertension    a. Dr. Fernand in [place] managing.  Previously on antihypertensive, which was later d/c'd.   GERD (gastroesophageal reflux disease)    Hyperlipidemia    Primary localized osteoarthritis of left knee    patient had left total knee 04/27/16   Primary localized osteoarthritis of right knee    PUD (peptic ulcer disease)    a. Remote h/o coffee grounds emesis followed by GI eval.  On Nexium  since.   S/P total knee replacement, left     Family History: Family History  Problem Relation Age of Onset   Parkinson's disease Mother        died @ 48   Pneumonia Father        died in his 27's   Heart disease Sister        1/2 sister - currently 7.   Hypertension Sister    Stroke Sister     Social History: Social History   Socioeconomic History   Marital status: Widowed    Spouse name: Not on file   Number of children: Not on file   Years of education: Not on file   Highest education  level: Not on file  Occupational History    Comment: Previously worked in [employer] in [employer]  Tobacco Use   Smoking status: Never   Smokeless tobacco: Never  Vaping Use   Vaping status: Never Used  Substance and Sexual Activity   Alcohol use: No   Drug use: No   Sexual activity: Not on file  Other Topics Concern   Not on file  Social History Narrative   Married takes care of wife who has Parkinson's and Early Alzheimers.  Fairly active.   Social Drivers of Corporate investment banker Strain: Low Risk  (06/17/2023)   Overall Financial Resource Strain (CARDIA)    Difficulty of Paying Living Expenses: Not hard at all  Food Insecurity: No Food Insecurity (06/17/2023)   Hunger Vital Sign    Worried About Running Out of Food in the  Last Year: Never true    Ran Out of Food in the Last Year: Never true  Transportation Needs: No Transportation Needs (06/17/2023)   PRAPARE - Administrator, Civil Service (Medical): No    Lack of Transportation (Non-Medical): No  Physical Activity: Insufficiently Active (06/17/2023)   Exercise Vital Sign    Days of Exercise per Week: 3 days    Minutes of Exercise per Session: 20 min  Stress: No Stress Concern Present (06/17/2023)   [employer] of Occupational Health - Occupational Stress Questionnaire    Feeling of Stress : Not at all  Social Connections: Moderately Isolated (06/17/2023)   Social Connection and Isolation Panel    Frequency of Communication with Friends and Family: More than three times a week    Frequency of Social Gatherings with Friends and Family: Three times a week    Attends Religious Services: More than 4 times per year    Active Member of Clubs or Organizations: No    Attends Banker Meetings: Never    Marital Status: Widowed      Review of Systems  Constitutional:  Negative for chills, fatigue and unexpected weight change.  HENT:  Positive for postnasal drip. Negative for congestion, rhinorrhea, sneezing and sore throat.   Eyes:  Negative for redness.  Respiratory:  Negative for cough, chest tightness and shortness of breath.   Cardiovascular:  Negative for chest pain and palpitations.  Gastrointestinal:  Negative for abdominal pain, constipation, diarrhea, nausea and vomiting.  Genitourinary:  Negative for dysuria and frequency.  Musculoskeletal:  Negative for arthralgias, back pain, joint swelling and neck pain.  Skin:  Negative for rash.  Neurological: Negative.  Negative for tremors and numbness.  Hematological:  Negative for adenopathy. Does not bruise/bleed easily.  Psychiatric/Behavioral:  Negative for behavioral problems (Depression), sleep disturbance and suicidal ideas. The patient is not nervous/anxious.       Vital Signs: BP 138/88   Pulse 60   Temp (!) 96.6 F (35.9 C)   Resp 16   Ht 5' 8 (1.727 m)   Wt 199 lb 6.4 oz (90.4 kg)   SpO2 96%   BMI 30.32 kg/m    Physical Exam Constitutional:      General: He is not in acute distress.    Appearance: He is well-developed. He is not diaphoretic.  HENT:     Head: Normocephalic and atraumatic.     Right Ear: External ear normal.     Left Ear: External ear normal.     Nose: Nose normal.     Mouth/Throat:     Pharynx: No oropharyngeal  exudate.  Eyes:     General: No scleral icterus.       Right eye: No discharge.        Left eye: No discharge.     Conjunctiva/sclera: Conjunctivae normal.     Pupils: Pupils are equal, round, and reactive to light.  Neck:     Thyroid : No thyromegaly.     Vascular: No JVD.     Trachea: No tracheal deviation.  Cardiovascular:     Rate and Rhythm: Normal rate and regular rhythm.     Heart sounds: Normal heart sounds. No murmur heard.    No friction rub. No gallop.  Pulmonary:     Effort: Pulmonary effort is normal. No respiratory distress.     Breath sounds: Normal breath sounds. No stridor. No wheezing or rales.  Chest:     Chest wall: No tenderness.  Abdominal:     General: Bowel sounds are normal. There is no distension.     Palpations: Abdomen is soft. There is no mass.     Tenderness: There is no abdominal tenderness. There is no guarding or rebound.  Genitourinary:    Comments: Scrotal sac is swollen and questionable fluid Musculoskeletal:        General: No tenderness or deformity. Normal range of motion.     Cervical back: Normal range of motion and neck supple.  Lymphadenopathy:     Cervical: No cervical adenopathy.  Skin:    General: Skin is warm and dry.     Coloration: Skin is not pale.     Findings: No erythema or rash.  Neurological:     Mental Status: He is alert.     Cranial Nerves: No cranial nerve deficit.     Motor: No abnormal muscle tone.     Coordination:  Coordination normal.     Deep Tendon Reflexes: Reflexes are normal and symmetric.  Psychiatric:        Behavior: Behavior normal.        Thought Content: Thought content normal.        Judgment: Judgment normal.        Assessment/Plan: 1. Encounter for general adult medical examination with abnormal findings (Primary) Pt is doing very well, will continue to monitor   2. Acute bronchitis due to other specified organisms Advised to take medications as prescribed  - CBC with Differential/Platelet - azithromycin (ZITHROMAX) 250 MG tablet; Take one tab a day for 10 days for uri  Dispense: 10 tablet; Refill: 0 - predniSONE (DELTASONE) 10 MG tablet; Take one tab 3 x day for 3 days, then take one tab 2 x a day for 3 days and then take one tab a day for 3 days for copd  Dispense: 18 tablet; Refill: 0  3. Gastroesophageal reflux disease without esophagitis stable - CBC with Differential/Platelet  4. Mixed hyperlipidemia Controlled  - Lipid Panel With LDL/HDL Ratio - TSH - T4, free  5. Essential hypertension Controlled  - Comprehensive metabolic panel with GFR  6. Screening for prostate cancer - PSA  7. Swelling of scrotum Will order U/S  - US  Scrotum; Future  8. Needs flu shot - Influenza, MDCK, trivalent, PF(Flucelvax egg-free)   General Counseling: Khi verbalizes understanding of the findings of todays visit and agrees with plan of treatment. I have discussed any further diagnostic evaluation that may be needed or ordered today. We also reviewed his medications today. he has been encouraged to call the office with any questions or concerns that should  arise related to todays visit.    Counseling:  [place] Controlled Substance Database was reviewed by me.  Orders Placed This Encounter  Procedures   US  Scrotum   Influenza, MDCK, trivalent, PF(Flucelvax egg-free)   CBC with Differential/Platelet   Lipid Panel With LDL/HDL Ratio   TSH   T4, free   Comprehensive  metabolic panel with GFR   PSA    Meds ordered this encounter  Medications   azithromycin (ZITHROMAX) 250 MG tablet    Sig: Take one tab a day for 10 days for uri    Dispense:  10 tablet    Refill:  0   predniSONE (DELTASONE) 10 MG tablet    Sig: Take one tab 3 x day for 3 days, then take one tab 2 x a day for 3 days and then take one tab a day for 3 days for copd    Dispense:  18 tablet    Refill:  0    Total time spent:45 Minutes  Time spent includes review of chart, medications, test results, and follow up plan with the patient.     Sigrid CHRISTELLA Bathe, MD  Internal Medicine

## 2024-06-24 LAB — CBC WITH DIFFERENTIAL/PLATELET
Basophils Absolute: 0 x10E3/uL (ref 0.0–0.2)
Basos: 0 %
EOS (ABSOLUTE): 0.1 x10E3/uL (ref 0.0–0.4)
Eos: 2 %
Hematocrit: 51.5 % — ABNORMAL HIGH (ref 37.5–51.0)
Hemoglobin: 17.3 g/dL (ref 13.0–17.7)
Immature Grans (Abs): 0 x10E3/uL (ref 0.0–0.1)
Immature Granulocytes: 0 %
Lymphocytes Absolute: 1.7 x10E3/uL (ref 0.7–3.1)
Lymphs: 25 %
MCH: 31.3 pg (ref 26.6–33.0)
MCHC: 33.6 g/dL (ref 31.5–35.7)
MCV: 93 fL (ref 79–97)
Monocytes Absolute: 0.5 x10E3/uL (ref 0.1–0.9)
Monocytes: 7 %
Neutrophils Absolute: 4.6 x10E3/uL (ref 1.4–7.0)
Neutrophils: 66 %
Platelets: 182 x10E3/uL (ref 150–450)
RBC: 5.53 x10E6/uL (ref 4.14–5.80)
RDW: 12.7 % (ref 11.6–15.4)
WBC: 7 x10E3/uL (ref 3.4–10.8)

## 2024-06-24 LAB — LIPID PANEL WITH LDL/HDL RATIO
Cholesterol, Total: 150 mg/dL (ref 100–199)
HDL: 44 mg/dL (ref >39)
LDL Chol Calc (NIH): 86 mg/dL (ref 0–99)
LDL/HDL Ratio: 2 ratio (ref 0.0–3.6)
Triglycerides: 107 mg/dL (ref 0–149)
VLDL Cholesterol Cal: 20 mg/dL (ref 5–40)

## 2024-06-24 LAB — COMPREHENSIVE METABOLIC PANEL WITH GFR
ALT: 11 IU/L (ref 0–44)
AST: 20 IU/L (ref 0–40)
Albumin: 4.6 g/dL (ref 3.6–4.6)
Alkaline Phosphatase: 92 IU/L (ref 48–129)
BUN/Creatinine Ratio: 16 (ref 10–24)
BUN: 19 mg/dL (ref 10–36)
Bilirubin Total: 2 mg/dL — ABNORMAL HIGH (ref 0.0–1.2)
CO2: 24 mmol/L (ref 20–29)
Calcium: 9.7 mg/dL (ref 8.6–10.2)
Chloride: 101 mmol/L (ref 96–106)
Creatinine, Ser: 1.2 mg/dL (ref 0.76–1.27)
Globulin, Total: 2.5 g/dL (ref 1.5–4.5)
Glucose: 103 mg/dL — ABNORMAL HIGH (ref 70–99)
Potassium: 4.6 mmol/L (ref 3.5–5.2)
Sodium: 139 mmol/L (ref 134–144)
Total Protein: 7.1 g/dL (ref 6.0–8.5)
eGFR: 57 mL/min/1.73 — ABNORMAL LOW (ref >59)

## 2024-06-24 LAB — T4, FREE: Free T4: 1.16 ng/dL (ref 0.82–1.77)

## 2024-06-24 LAB — PSA: Prostate Specific Ag, Serum: 3.8 ng/mL (ref 0.0–4.0)

## 2024-06-24 LAB — TSH: TSH: 2.5 u[IU]/mL (ref 0.450–4.500)

## 2024-06-28 ENCOUNTER — Ambulatory Visit
Admission: RE | Admit: 2024-06-28 | Discharge: 2024-06-28 | Disposition: A | Discharge status: Home / Self Care | Source: Ambulatory Visit | Attending: Internal Medicine | Admitting: Internal Medicine

## 2024-06-28 DIAGNOSIS — N5089 Other specified disorders of the male genital organs: Secondary | ICD-10-CM | POA: Insufficient documentation

## 2024-07-05 ENCOUNTER — Ambulatory Visit: Payer: Self-pay | Admitting: Internal Medicine

## 2024-07-05 NOTE — Progress Notes (Signed)
 FYI Will be discussed with pt on next f/u

## 2024-07-13 ENCOUNTER — Ambulatory Visit (INDEPENDENT_AMBULATORY_CARE_PROVIDER_SITE_OTHER): Admitting: Nurse Practitioner

## 2024-07-13 ENCOUNTER — Encounter: Payer: Self-pay | Admitting: Nurse Practitioner

## 2024-07-13 VITALS — BP 130/86 | HR 65 | Temp 96.8°F | Resp 16 | Ht 68.0 in | Wt 195.8 lb

## 2024-07-13 DIAGNOSIS — N433 Hydrocele, unspecified: Secondary | ICD-10-CM

## 2024-07-13 DIAGNOSIS — R17 Unspecified jaundice: Secondary | ICD-10-CM | POA: Diagnosis not present

## 2024-07-13 DIAGNOSIS — N289 Disorder of kidney and ureter, unspecified: Secondary | ICD-10-CM | POA: Diagnosis not present

## 2024-07-13 DIAGNOSIS — R718 Other abnormality of red blood cells: Secondary | ICD-10-CM

## 2024-07-13 DIAGNOSIS — J3089 Other allergic rhinitis: Secondary | ICD-10-CM | POA: Diagnosis not present

## 2024-07-13 MED ORDER — LEVOCETIRIZINE DIHYDROCHLORIDE 5 MG PO TABS: 5.0000 mg | ORAL_TABLET | Freq: Every evening | ORAL | 1 refills | Status: AC

## 2024-07-13 NOTE — Progress Notes (Signed)
 [facility] [address] [place], [place] [address]  Internal MEDICINE  Office Visit Note  Patient Name: Danny [name]  [id number]  [id number]  Date of Service: 07/13/2024  Chief Complaint  Patient presents with   Gastroesophageal Reflux   Hypertension   Hyperlipidemia   Follow-up    Review u/s    HPI Yuvaan presents for a follow-up visit for lab results and ultrasound of scrotum.  Abnormal kidney function -- eGFR of 57, slightly improved from 2023. Serum creatinine is 1.20.  Small right hydrocele -- asymptomatic  Large left hydrocele - asymptomatic Slightly elevated hematocrit Elevated bilirubin level at 2.0, has been trending up. It has been chronically elevated on prior labs. All other liver function testing is normal.  PSA is normal at 3.8 Normal thyroid  labs, normal cholesterol panel Slightly elevated fasting glucose of 103.  Potassium has improved to normal range Given Zpak for possible URI at last visit. Still having symptoms of runny nose, nasal congestion and postnasal drip. Not currently on any medication for allergies or allergic rhinitis.     Current Medication: Outpatient Encounter Medications as of 07/13/2024  Medication Sig   levocetirizine (XYZAL ) 5 MG tablet Take 1 tablet (5 mg total) by mouth every evening.   esomeprazole  (NEXIUM ) 40 MG capsule TAKE 1 CAPSULE BY MOUTH EVERY DAY   furosemide  (LASIX ) 20 MG tablet Take 1 tablet by mouth daily as needed for swelling   metoprolol  succinate (TOPROL -XL) 25 MG 24 hr tablet TAKE 1 TABLET (25 MG TOTAL) BY MOUTH DAILY.   pravastatin  (PRAVACHOL ) 40 MG tablet TAKE 1 TABLET BY MOUTH EVERYDAY AT BEDTIME   [DISCONTINUED] azithromycin  (ZITHROMAX ) 250 MG tablet Take one tab a day for 10 days for uri   [DISCONTINUED] predniSONE  (DELTASONE ) 10 MG tablet Take one tab 3 x day for 3 days, then take one tab 2 x a day for 3 days and then take one tab a day for 3 days for copd   No facility-administered encounter  medications on file as of 07/13/2024.    Surgical History: Past Surgical History:  Procedure Laterality Date   CATARACT EXTRACTION, BILATERAL     CHOLECYSTECTOMY  2003   FINGER AMPUTATION     partial - pinkie on R hand    REPLACEMENT TOTAL KNEE Right    TOTAL KNEE ARTHROPLASTY Left 04/27/2016   Procedure: LEFT TOTAL KNEE ARTHROPLASTY;  Surgeon: Lamar Millman, MD;  Location: [facility] OR;  Service: Orthopedics;  Laterality: Left;   TOTAL KNEE ARTHROPLASTY Right 04/04/2018   Procedure: TOTAL KNEE ARTHROPLASTY;  Surgeon: Millman Lamar, MD;  Location: [facility] OR;  Service: Orthopedics;  Laterality: Right;    Medical History: Past Medical History:  Diagnosis Date   Essential hypertension    a. Dr. Fernand in [place] managing.  Previously on antihypertensive, which was later d/c'd.   GERD (gastroesophageal reflux disease)    Hyperlipidemia    Primary localized osteoarthritis of left knee    patient had left total knee 04/27/16   Primary localized osteoarthritis of right knee    PUD (peptic ulcer disease)    a. Remote h/o coffee grounds emesis followed by GI eval.  On Nexium  since.   S/P total knee replacement, left     Family History: Family History  Problem Relation Age of Onset   Parkinson's disease Mother        died @ 39   Pneumonia Father        died in his 11's   Heart disease  Sister        1/2 sister - currently 62.   Hypertension Sister    Stroke Sister     Social History   Socioeconomic History   Marital status: Widowed    Spouse name: Not on file   Number of children: Not on file   Years of education: Not on file   Highest education level: Not on file  Occupational History    Comment: Previously worked in [employer] in [employer]  Tobacco Use   Smoking status: Never   Smokeless tobacco: Never  Vaping Use   Vaping status: Never Used  Substance and Sexual Activity   Alcohol use: No   Drug use: No   Sexual activity: Not on file  Other Topics Concern   Not on file   Social History Narrative   Married takes care of wife who has Parkinson's and Early Alzheimers.  Fairly active.   Social Drivers of Corporate Investment Banker Strain: Low Risk  (06/17/2023)   Overall Financial Resource Strain (CARDIA)    Difficulty of Paying Living Expenses: Not hard at all  Food Insecurity: No Food Insecurity (06/17/2023)   Hunger Vital Sign    Worried About Running Out of Food in the Last Year: Never true    Ran Out of Food in the Last Year: Never true  Transportation Needs: No Transportation Needs (06/17/2023)   PRAPARE - Administrator, Civil Service (Medical): No    Lack of Transportation (Non-Medical): No  Physical Activity: Insufficiently Active (06/17/2023)   Exercise Vital Sign    Days of Exercise per Week: 3 days    Minutes of Exercise per Session: 20 min  Stress: No Stress Concern Present (06/17/2023)   [employer] of Occupational Health - Occupational Stress Questionnaire    Feeling of Stress : Not at all  Social Connections: Moderately Isolated (06/17/2023)   Social Connection and Isolation Panel    Frequency of Communication with Friends and Family: More than three times a week    Frequency of Social Gatherings with Friends and Family: Three times a week    Attends Religious Services: More than 4 times per year    Active Member of Clubs or Organizations: No    Attends Banker Meetings: Never    Marital Status: Widowed  Intimate Partner Violence: Not At Risk (06/17/2023)   Humiliation, Afraid, Rape, and Kick questionnaire    Fear of Current or Ex-Partner: No    Emotionally Abused: No    Physically Abused: No    Sexually Abused: No      Review of Systems  Constitutional:  Negative for chills, fatigue and unexpected weight change.  HENT:  Negative for congestion, rhinorrhea, sneezing and sore throat.   Eyes:  Negative for redness.  Respiratory:  Negative for cough, chest tightness and shortness of breath.    Cardiovascular:  Negative for chest pain and palpitations.  Gastrointestinal:  Negative for abdominal pain, constipation, diarrhea, nausea and vomiting.  Genitourinary:  Negative for dysuria and frequency.  Musculoskeletal:  Negative for arthralgias, back pain, joint swelling and neck pain.  Skin:  Negative for rash.  Neurological: Negative.  Negative for tremors and numbness.  Hematological:  Negative for adenopathy. Does not bruise/bleed easily.  Psychiatric/Behavioral:  Negative for behavioral problems (Depression), sleep disturbance and suicidal ideas. The patient is not nervous/anxious.     Vital Signs: BP 130/86   Pulse 65   Temp (!) 96.8 F (36 C)  Resp 16   Ht 5' 8 (1.727 m)   Wt 195 lb 12.8 oz (88.8 kg)   SpO2 96%   BMI 29.77 kg/m    Physical Exam Vitals reviewed.  Constitutional:      General: He is not in acute distress.    Appearance: Normal appearance. He is well-developed. He is obese. He is not ill-appearing or diaphoretic.  HENT:     Head: Normocephalic and atraumatic.  Eyes:     Pupils: Pupils are equal, round, and reactive to light.  Neck:     Thyroid : No thyromegaly.     Vascular: No JVD.     Trachea: No tracheal deviation.  Cardiovascular:     Rate and Rhythm: Normal rate and regular rhythm.  Pulmonary:     Effort: Pulmonary effort is normal. No respiratory distress.  Musculoskeletal:     Cervical back: Normal range of motion and neck supple.  Neurological:     Mental Status: He is alert and oriented to person, place, and time.     Motor: No abnormal muscle tone.     Gait: Gait normal.     Deep Tendon Reflexes: Reflexes are normal and symmetric.  Psychiatric:        Mood and Affect: Mood normal.        Behavior: Behavior normal.        Assessment/Plan: 1. Hydrocele, bilateral (Primary) Asymptomatic, no intervention needed at this time,   2. Abnormal kidney function Mildly decreased, will continue to monitor on routine labs.   3.  Total bilirubin, elevated Noted on labs with normal LFTs. Will continue to monitor on routine labs   4. Non-seasonal allergic rhinitis, unspecified trigger Start levocetirizine daily for sinus drainage - levocetirizine (XYZAL ) 5 MG tablet; Take 1 tablet (5 mg total) by mouth every evening.  Dispense: 90 tablet; Refill: 1  5. Elevated hematocrit Noted on labs, only slightly elevated, no intervention needed at this time, will continue to monitor on routine labs    General Counseling: Cavin verbalizes understanding of the findings of todays visit and agrees with plan of treatment. I have discussed any further diagnostic evaluation that may be needed or ordered today. We also reviewed his medications today. he has been encouraged to call the office with any questions or concerns that should arise related to todays visit.    No orders of the defined types were placed in this encounter.   Meds ordered this encounter  Medications   levocetirizine (XYZAL ) 5 MG tablet    Sig: Take 1 tablet (5 mg total) by mouth every evening.    Dispense:  90 tablet    Refill:  1    Fill new script today.    Return in about 6 months (around 01/10/2025) for F/U, Adele Milson PCP.   Total time spent:30 Minutes Time spent includes review of chart, medications, test results, and follow up plan with the patient.    Controlled Substance Database was reviewed by me.  This patient was seen by Mardy Maxin, FNP-C in collaboration with Dr. Sigrid Bathe as a part of collaborative care agreement.   Maleya Leever R. Maxin, MSN, FNP-C Internal medicine

## 2024-08-08 ENCOUNTER — Encounter: Payer: Self-pay | Admitting: Nurse Practitioner

## 2025-01-11 ENCOUNTER — Ambulatory Visit: Admitting: Nurse Practitioner

## 2025-06-25 ENCOUNTER — Ambulatory Visit: Admitting: Nurse Practitioner
